# Patient Record
Sex: Female | Born: 1965 | Hispanic: Yes | Marital: Single | Smoking: Never smoker
Health system: Southern US, Community
[De-identification: ages and names within clinical notes are randomized; demographics above are authoritative.]

## PROBLEM LIST (undated history)

## (undated) DIAGNOSIS — J45909 Unspecified asthma, uncomplicated: Secondary | ICD-10-CM

## (undated) DIAGNOSIS — D649 Anemia, unspecified: Secondary | ICD-10-CM

## (undated) DIAGNOSIS — N63 Unspecified lump in unspecified breast: Secondary | ICD-10-CM

## (undated) HISTORY — DX: Unspecified lump in unspecified breast: N63.0

## (undated) HISTORY — DX: Unspecified asthma, uncomplicated: J45.909

## (undated) HISTORY — DX: Anemia, unspecified: D64.9

---

## 2002-06-10 DIAGNOSIS — J45909 Unspecified asthma, uncomplicated: Secondary | ICD-10-CM

## 2002-06-10 HISTORY — DX: Unspecified asthma, uncomplicated: J45.909

## 2007-08-08 ENCOUNTER — Emergency Department: Payer: Self-pay | Admitting: Emergency Medicine

## 2007-11-26 ENCOUNTER — Encounter: Payer: Self-pay | Admitting: Maternal & Fetal Medicine

## 2007-12-08 ENCOUNTER — Emergency Department: Payer: Self-pay | Admitting: Unknown Physician Specialty

## 2008-07-03 ENCOUNTER — Emergency Department: Payer: Self-pay | Admitting: Emergency Medicine

## 2011-06-11 DIAGNOSIS — D649 Anemia, unspecified: Secondary | ICD-10-CM

## 2011-06-11 DIAGNOSIS — N63 Unspecified lump in unspecified breast: Secondary | ICD-10-CM

## 2011-06-11 HISTORY — PX: BREAST BIOPSY: SHX20

## 2011-06-11 HISTORY — DX: Anemia, unspecified: D64.9

## 2011-06-11 HISTORY — DX: Unspecified lump in unspecified breast: N63.0

## 2011-06-11 HISTORY — PX: BREAST SURGERY: SHX581

## 2011-11-06 LAB — CBC
HCT: 22.1 % — ABNORMAL LOW (ref 35.0–47.0)
HGB: 6.4 g/dL — ABNORMAL LOW (ref 12.0–16.0)
MCV: 61 fL — ABNORMAL LOW (ref 80–100)
Platelet: 254 10*3/uL (ref 150–440)
RBC: 3.64 10*6/uL — ABNORMAL LOW (ref 3.80–5.20)
RDW: 20.1 % — ABNORMAL HIGH (ref 11.5–14.5)

## 2011-11-06 LAB — URINALYSIS, COMPLETE
Bacteria: NONE SEEN
Bilirubin,UR: NEGATIVE
Glucose,UR: NEGATIVE mg/dL (ref 0–75)
RBC,UR: NONE SEEN /HPF (ref 0–5)
Specific Gravity: 1.006 (ref 1.003–1.030)
WBC UR: NONE SEEN /HPF (ref 0–5)

## 2011-11-06 LAB — COMPREHENSIVE METABOLIC PANEL
Alkaline Phosphatase: 144 U/L — ABNORMAL HIGH (ref 50–136)
Anion Gap: 11 (ref 7–16)
Calcium, Total: 8.9 mg/dL (ref 8.5–10.1)
Chloride: 109 mmol/L — ABNORMAL HIGH (ref 98–107)
EGFR (African American): >60
EGFR (Non-African Amer.): >60
Glucose: 87 mg/dL (ref 65–99)
Osmolality: 278 (ref 275–301)
Potassium: 3.8 mmol/L (ref 3.5–5.1)
SGPT (ALT): 17 U/L
Sodium: 141 mmol/L (ref 136–145)
Total Protein: 8 g/dL (ref 6.4–8.2)

## 2011-11-06 LAB — HCG, QUANTITATIVE, PREGNANCY: Beta Hcg, Quant.: <1 m[IU]/mL — ABNORMAL LOW

## 2011-11-07 ENCOUNTER — Inpatient Hospital Stay: Payer: Self-pay | Admitting: Internal Medicine

## 2011-11-07 LAB — TROPONIN I: Troponin-I: <0.02 ng/mL

## 2011-11-07 LAB — FERRITIN: Ferritin (ARMC): 2 ng/mL — ABNORMAL LOW (ref 8–388)

## 2011-11-07 LAB — IRON AND TIBC
Iron Bind.Cap.(Total): 539 ug/dL — ABNORMAL HIGH (ref 250–450)
Iron Saturation: 2 %
Iron: 12 ug/dL — ABNORMAL LOW (ref 50–170)
Unbound Iron-Bind.Cap.: 527 ug/dL

## 2011-11-07 LAB — FOLATE: Folic Acid: 19.2 ng/mL (ref 3.1–100.0)

## 2011-11-07 LAB — CBC WITH DIFFERENTIAL/PLATELET
Basophil %: 0.6 %
Eosinophil #: 0.1 10*3/uL (ref 0.0–0.7)
Eosinophil %: 2.5 %
HGB: 7.1 g/dL — ABNORMAL LOW (ref 12.0–16.0)
Lymphocyte #: 1.6 10*3/uL (ref 1.0–3.6)
Lymphocyte %: 32 %
MCHC: 29.5 g/dL — ABNORMAL LOW (ref 32.0–36.0)
Monocyte %: 8.1 %
Platelet: 243 10*3/uL (ref 150–440)
RBC: 3.79 10*6/uL — ABNORMAL LOW (ref 3.80–5.20)
RDW: 23.7 % — ABNORMAL HIGH (ref 11.5–14.5)
WBC: 5 10*3/uL (ref 3.6–11.0)

## 2011-11-07 LAB — CK-MB: CK-MB: 0.7 ng/mL (ref 0.5–3.6)

## 2011-11-07 LAB — RETICULOCYTES
Absolute Retic Count: 0.1006 10*6/uL — ABNORMAL HIGH (ref 0.024–0.084)
Reticulocyte: 2.74 % — ABNORMAL HIGH (ref 0.5–1.5)

## 2011-11-07 LAB — LACTATE DEHYDROGENASE: LDH: 195 U/L (ref 84–246)

## 2011-11-09 HISTORY — PX: OTHER SURGICAL HISTORY: SHX169

## 2011-12-03 ENCOUNTER — Ambulatory Visit: Payer: Self-pay | Admitting: Family Medicine

## 2011-12-10 ENCOUNTER — Ambulatory Visit: Payer: Self-pay | Admitting: Family Medicine

## 2011-12-31 ENCOUNTER — Ambulatory Visit: Payer: Self-pay

## 2012-02-19 LAB — PATHOLOGY REPORT

## 2012-04-22 ENCOUNTER — Ambulatory Visit: Payer: Self-pay | Admitting: General Surgery

## 2013-01-06 ENCOUNTER — Ambulatory Visit: Payer: Self-pay

## 2013-01-25 ENCOUNTER — Ambulatory Visit: Payer: Self-pay | Admitting: General Surgery

## 2013-02-02 ENCOUNTER — Ambulatory Visit: Payer: Self-pay | Admitting: General Surgery

## 2013-02-04 ENCOUNTER — Ambulatory Visit: Payer: Self-pay | Admitting: General Surgery

## 2013-02-18 ENCOUNTER — Encounter: Payer: Self-pay | Admitting: *Deleted

## 2013-06-19 IMAGING — CT CT HEAD WITHOUT CONTRAST
2 series · 16 of 30 positions shown, 20 images · non-contrast
Comparison: none

REASON FOR EXAM: headache
COMMENTS:

PROCEDURE:     CT  - CT HEAD WITHOUT CONTRAST  - November 07, 2011  [DATE]
RESULT:     Comparison:  None
TECHNIQUE: Multiple axial images from the foramen magnum to the vertex were
obtained without IV contrast.

[Series 2: without · axial · non-contrast · 0.38mm/px · z∈[+358,+478]mm · 13 of 29 slices shown, 17 images]
[im 3/29  brain]
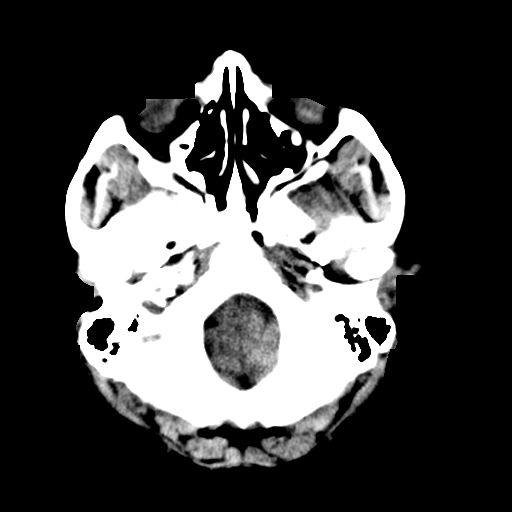
[im 3/29  bone]
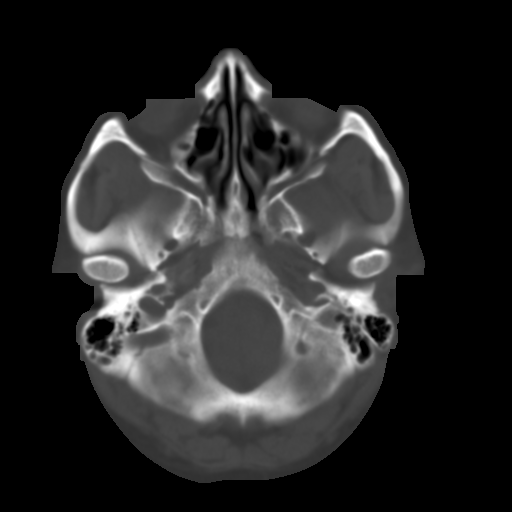
[im 5/29  brain]
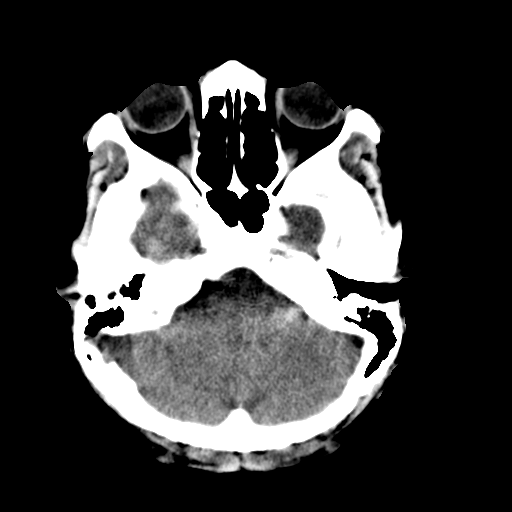
[im 7/29  brain]
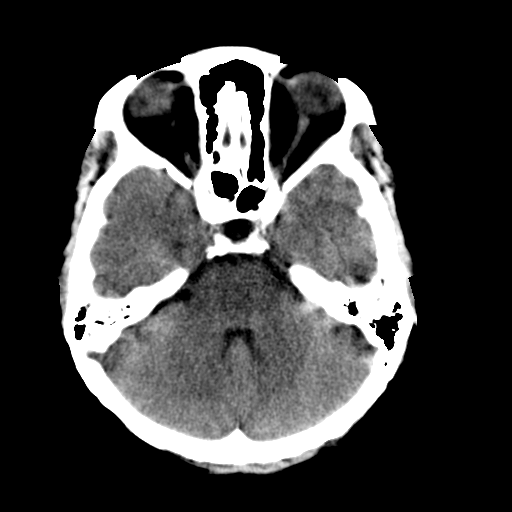
[im 9/29  brain]
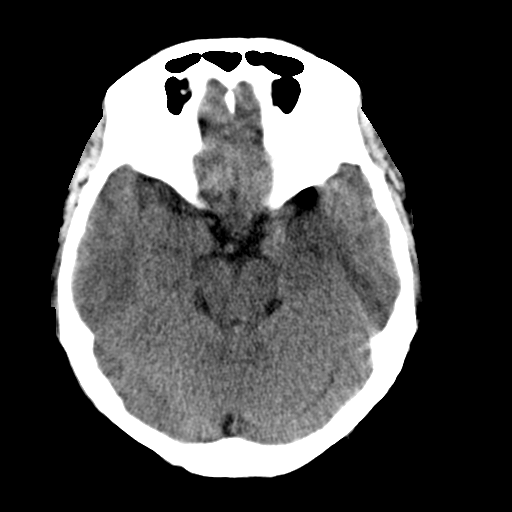
[im 11/29  brain]
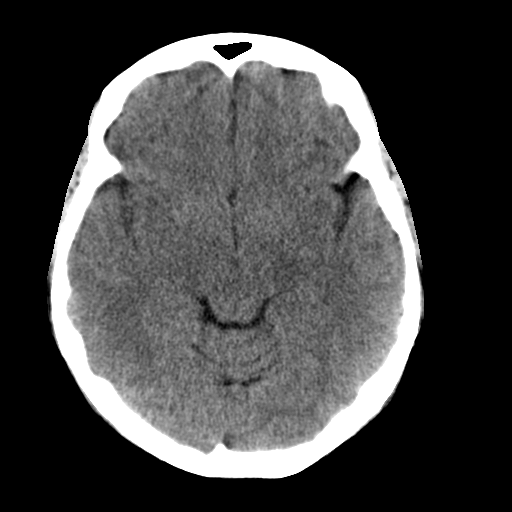
[im 11/29  bone]
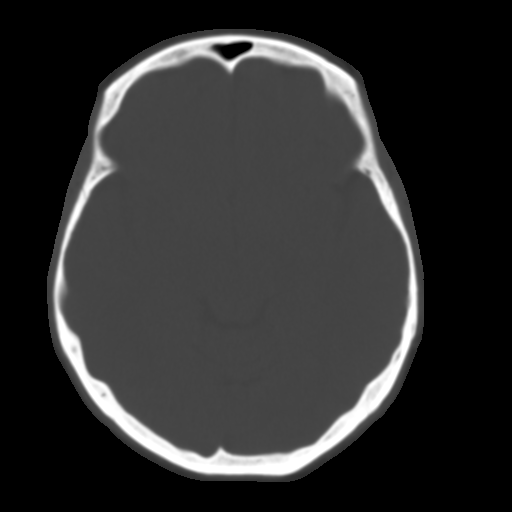
[im 13/29  brain]
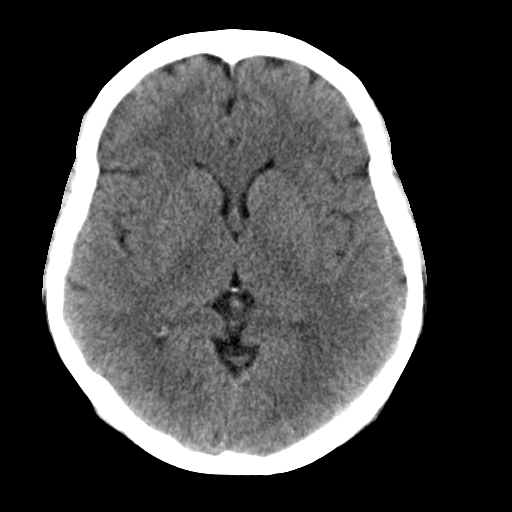
[im 15/29  brain]
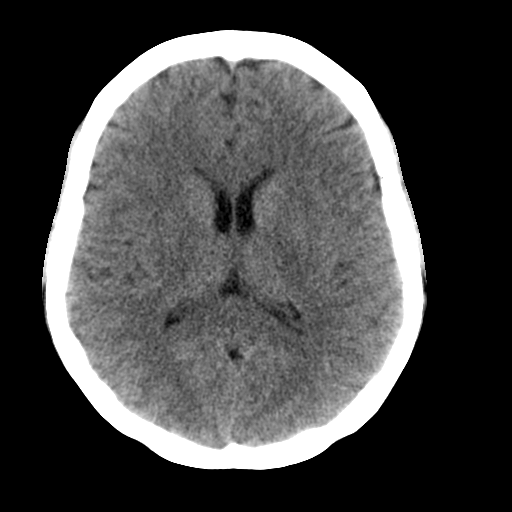
[im 17/29  brain]
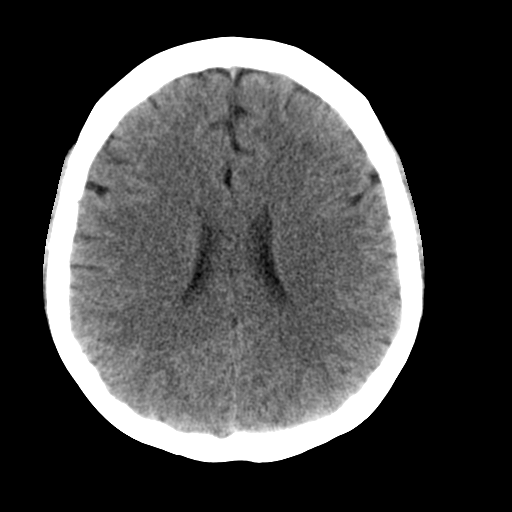
[im 19/29  brain]
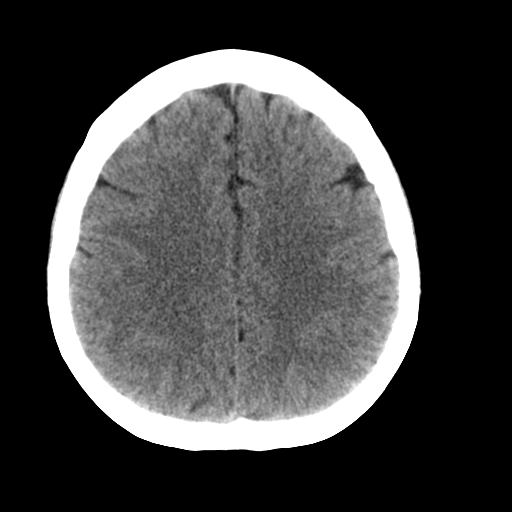
[im 19/29  bone]
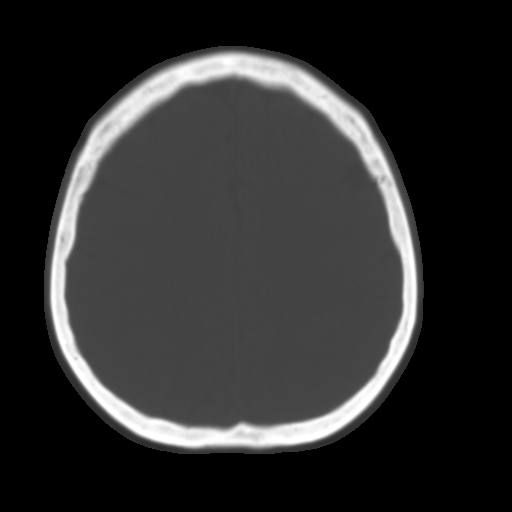
[im 21/29  brain]
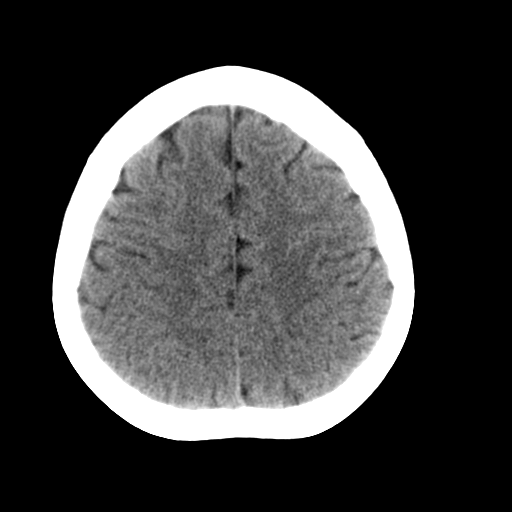
[im 23/29  brain]
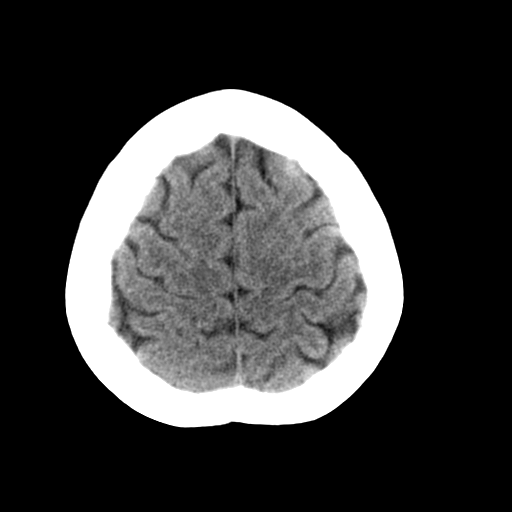
[im 25/29  brain]
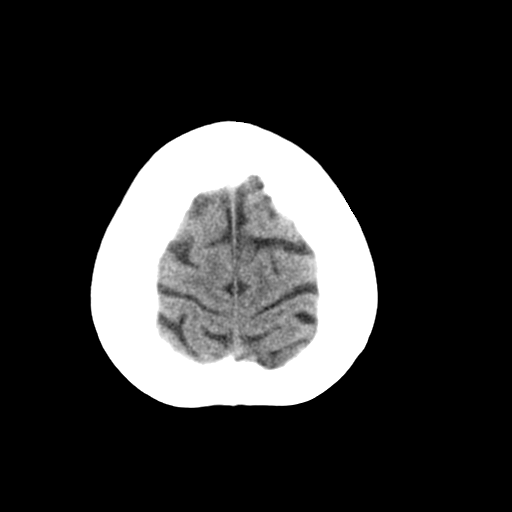
[im 27/29  brain]
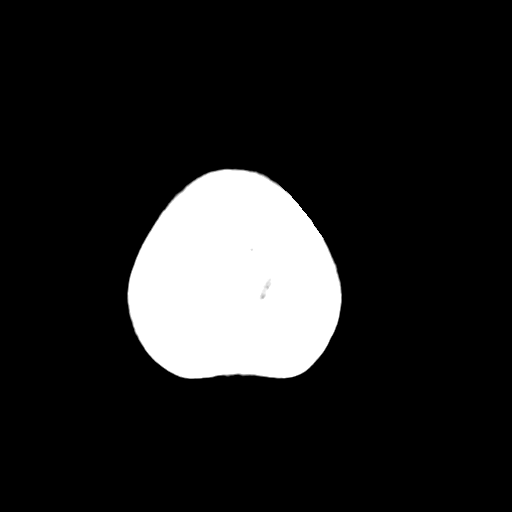
[im 27/29  bone]
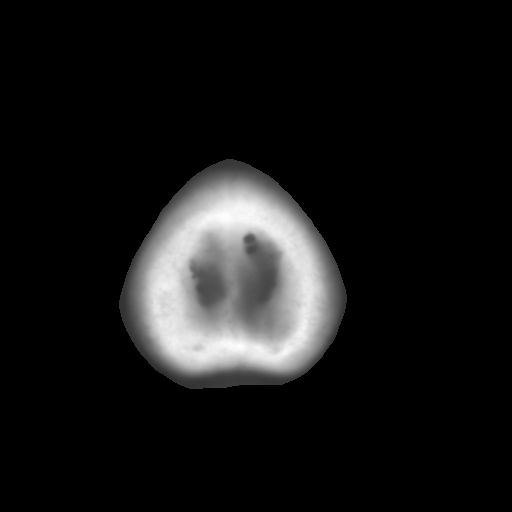

[Series 3: bone · axial · 0.38mm/px · z∈[+358,+398]mm · 3 of 29 slices shown]
[im 3/29  bone]
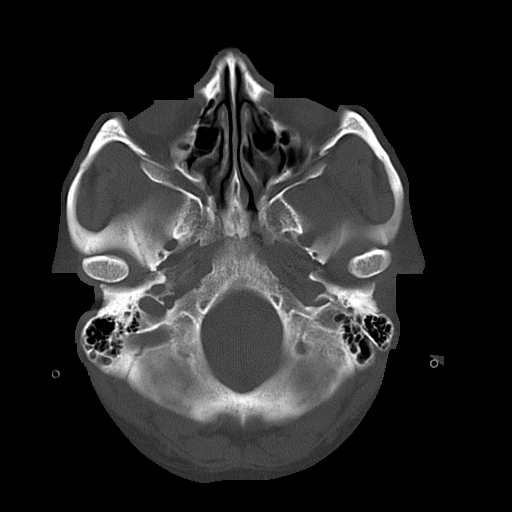
[im 7/29  bone]
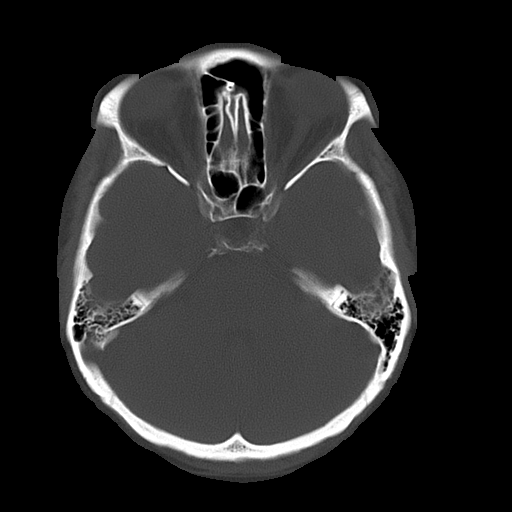
[im 11/29  bone]
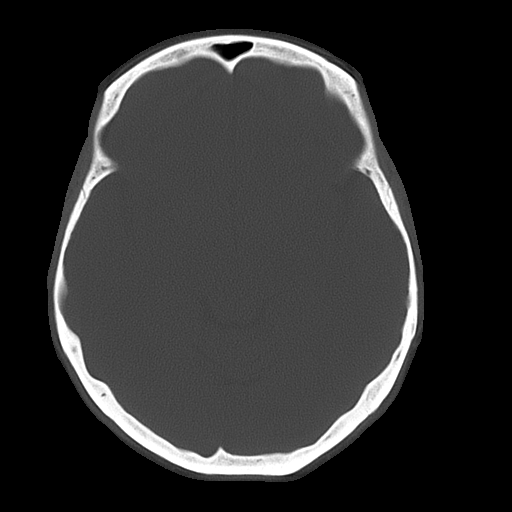

[16 of 30 positions shown; findings below may reference images not displayed]

FINDINGS: There is no evidence of mass effect, midline shift, or extra-axial fluid
collections.  There is no evidence of a space-occupying lesion or
intracranial hemorrhage. There is no evidence of a cortical-based area of
acute infarction.

The ventricles and sulci are appropriate for the patient's age. The basal
cisterns are patent.

Visualized portions of the orbits are unremarkable. The visualized portions
of the paranasal sinuses and mastoid air cells are unremarkable.

The osseous structures are unremarkable.
IMPRESSION: No acute intracranial process.

[REDACTED]

## 2014-03-10 LAB — COMPREHENSIVE METABOLIC PANEL
ALK PHOS: 218 U/L — AB
ALT: 52 U/L
AST: 44 U/L — AB (ref 15–37)
Albumin: 4.5 g/dL (ref 3.4–5.0)
Anion Gap: 3 — ABNORMAL LOW (ref 7–16)
BILIRUBIN TOTAL: 0.4 mg/dL (ref 0.2–1.0)
BUN: 9 mg/dL (ref 7–18)
CALCIUM: 10 mg/dL (ref 8.5–10.1)
Chloride: 109 mmol/L — ABNORMAL HIGH (ref 98–107)
Co2: 28 mmol/L (ref 21–32)
Creatinine: 0.87 mg/dL (ref 0.60–1.30)
EGFR (African American): >60
GLUCOSE: 106 mg/dL — AB (ref 65–99)
Osmolality: 279 (ref 275–301)
Potassium: 3.9 mmol/L (ref 3.5–5.1)
SODIUM: 140 mmol/L (ref 136–145)
TOTAL PROTEIN: 8.6 g/dL — AB (ref 6.4–8.2)

## 2014-03-10 LAB — CBC WITH DIFFERENTIAL/PLATELET
Basophil #: 0 10*3/uL (ref 0.0–0.1)
Basophil %: 0.4 %
EOS PCT: 2.4 %
Eosinophil #: 0.2 10*3/uL (ref 0.0–0.7)
HCT: 41.7 % (ref 35.0–47.0)
HGB: 13.4 g/dL (ref 12.0–16.0)
LYMPHS PCT: 21.3 %
Lymphocyte #: 1.7 10*3/uL (ref 1.0–3.6)
MCH: 25.7 pg — AB (ref 26.0–34.0)
MCHC: 32.1 g/dL (ref 32.0–36.0)
MCV: 80 fL (ref 80–100)
MONO ABS: 0.5 x10 3/mm (ref 0.2–0.9)
Monocyte %: 5.7 %
Neutrophil #: 5.6 10*3/uL (ref 1.4–6.5)
Neutrophil %: 70.2 %
Platelet: 236 10*3/uL (ref 150–440)
RBC: 5.21 10*6/uL — AB (ref 3.80–5.20)
RDW: 15.3 % — AB (ref 11.5–14.5)
WBC: 8 10*3/uL (ref 3.6–11.0)

## 2014-03-10 LAB — URINALYSIS, COMPLETE
BACTERIA: NONE SEEN
Bilirubin,UR: NEGATIVE
Blood: NEGATIVE
GLUCOSE, UR: NEGATIVE mg/dL (ref 0–75)
Ketone: NEGATIVE
Leukocyte Esterase: NEGATIVE
Nitrite: NEGATIVE
PH: 6 (ref 4.5–8.0)
Protein: NEGATIVE
Specific Gravity: 1.001 (ref 1.003–1.030)

## 2014-03-10 LAB — LIPASE, BLOOD: LIPASE: 217 U/L (ref 73–393)

## 2014-03-10 LAB — TROPONIN I
TROPONIN-I: 0.04 ng/mL
TROPONIN-I: 0.06 ng/mL — AB

## 2014-03-11 ENCOUNTER — Observation Stay: Payer: Self-pay | Admitting: Internal Medicine

## 2014-03-11 DIAGNOSIS — F419 Anxiety disorder, unspecified: Secondary | ICD-10-CM

## 2014-03-11 DIAGNOSIS — R000 Tachycardia, unspecified: Secondary | ICD-10-CM

## 2014-03-11 DIAGNOSIS — I361 Nonrheumatic tricuspid (valve) insufficiency: Secondary | ICD-10-CM

## 2014-03-11 DIAGNOSIS — I255 Ischemic cardiomyopathy: Secondary | ICD-10-CM

## 2014-03-11 LAB — HEMOGLOBIN A1C: HEMOGLOBIN A1C: 5.9 % (ref 4.2–6.3)

## 2014-03-11 LAB — CK TOTAL AND CKMB (NOT AT ARMC)
CK, TOTAL: 116 U/L
CK, Total: 125 U/L
CK, Total: 118 U/L
CK-MB: 0.8 ng/mL (ref 0.5–3.6)
CK-MB: 0.9 ng/mL (ref 0.5–3.6)
CK-MB: 0.8 ng/mL (ref 0.5–3.6)

## 2014-03-11 LAB — TROPONIN I: Troponin-I: <0.02 ng/mL

## 2014-03-18 ENCOUNTER — Telehealth: Payer: Self-pay

## 2014-03-18 NOTE — Telephone Encounter (Signed)
Spoke w/ Dr. Mariah MillingGollan who approved pt for #30 w/ no refills, [facility].  [facility] message w/ this info at [facility] and asked them to call back w/ any questions or concerns.

## 2014-03-18 NOTE — Telephone Encounter (Signed)
Pharmacy @ [facility], regarding a controlled substance Dr. Mariah MillingGollan prescribed (Xanax) and did not date and cannot fill until dates. Please call, pharmacy closed at 1.

## 2014-03-27 ENCOUNTER — Emergency Department: Payer: Self-pay | Admitting: Emergency Medicine

## 2014-03-27 LAB — BASIC METABOLIC PANEL
ANION GAP: 14 (ref 7–16)
BUN: 10 mg/dL (ref 7–18)
CALCIUM: 8.7 mg/dL (ref 8.5–10.1)
CREATININE: 0.95 mg/dL (ref 0.60–1.30)
Chloride: 104 mmol/L (ref 98–107)
Co2: 22 mmol/L (ref 21–32)
EGFR (African American): >60
EGFR (Non-African Amer.): >60
GLUCOSE: 125 mg/dL — AB (ref 65–99)
Osmolality: 280 (ref 275–301)
Potassium: 3.3 mmol/L — ABNORMAL LOW (ref 3.5–5.1)
Sodium: 140 mmol/L (ref 136–145)

## 2014-03-27 LAB — CBC
HCT: 39.3 % (ref 35.0–47.0)
HGB: 12.3 g/dL (ref 12.0–16.0)
MCH: 25.4 pg — ABNORMAL LOW (ref 26.0–34.0)
MCHC: 31.4 g/dL — AB (ref 32.0–36.0)
MCV: 81 fL (ref 80–100)
Platelet: 210 10*3/uL (ref 150–440)
RBC: 4.86 10*6/uL (ref 3.80–5.20)
RDW: 15.1 % — ABNORMAL HIGH (ref 11.5–14.5)
WBC: 7.5 10*3/uL (ref 3.6–11.0)

## 2014-03-27 LAB — HEPATIC FUNCTION PANEL A (ARMC)
Albumin: 3.9 g/dL (ref 3.4–5.0)
Alkaline Phosphatase: 231 U/L — ABNORMAL HIGH
BILIRUBIN TOTAL: 0.4 mg/dL (ref 0.2–1.0)
SGOT(AST): 72 U/L — ABNORMAL HIGH (ref 15–37)
SGPT (ALT): 83 U/L — ABNORMAL HIGH
Total Protein: 8.1 g/dL (ref 6.4–8.2)

## 2014-03-27 LAB — TROPONIN I

## 2014-03-27 LAB — LIPASE, BLOOD: Lipase: 241 U/L (ref 73–393)

## 2014-10-01 NOTE — Consult Note (Signed)
General Aspect PCP: [name] Primary Cardiologist: New to [name] _______________________  49 year old female without known history of cardiac disease, but history of HTN and anxiety and depression presented to [name] 03/10/2014 with chest pain after eating a pear with associated epigastric pain, SOB & anxiety which all lasted for 10 minutes before self resolving.  ______________________  PMH: 1) HTN 2) Anxiety 3) Depression ________________________   Present Illness 49 year old female with no known prior cardiac history, but history of HTN presented to [name] on 03/10/2014 with chest after eating a pear with associated epigastric pain, SOB, & anxiety which all lasted for about 10 minutes before self resolving. Cardiology consulted for chest pain, SOB  She did have similar symptoms about 1 month prior, not related to exertion and called her PCP's office to discuss. She was advised to go to the ED at that time but she did not. She also did not follow up with her PCP. She does relate a history, however of both exertional and non-exertional chest pain that radiates to her back. This has been going on for several months. She has never had this evaluated and reports a "crackle" sensation when exerting herself. She is not limited by this sensation. Her exertional limits are only 2/2 her bilateral knee pain.   On 03/10/2014 while eating a pear she bit into it and noticed something that was large, black, and hard. She thought this was poison and panicked. She had her son call 911, developed palpitations, anxiety, and chest pain/epigastric pain. She also drak milk as she believed this was good for poison. She denies any diaphoresis, vomiting, presyncope, or syncope.    Upon her arrival to [name] via EMS her initial 2 TnI were negative at <0.02--0.04, and she remained symptom free until late in the night. However, her third TnI came back at 0.06 and she was admitted for further evaluation. EKG with NSR,  68, inferior q's III, avF, and TWI V2. She did have 2nd episode of anxiety with associated epigastric pain while in the ED over night around 1 AM. EKG at that time showed sinus tach, 102, with a wandering baseline. Follow up EKG showed NSR, 86. The patient received 2 mg of Ativan IV at that time, which was ordered by the ED physician. Currently she asymptomatic and has remained that way since her episode in the ED overnight.  Her son is here for help with translation.   Physical Exam:  GEN well developed, well nourished, no acute distress   HEENT hearing intact to voice, moist oral mucosa   NECK supple   RESP normal resp effort  clear BS   CARD Regular rate and rhythm  No murmur   ABD denies tenderness  soft  normal BS   EXTR negative edema   SKIN normal to palpation   NEURO cranial nerves intact   PSYCH alert, A+O to time, place, person, good insight   Review of Systems:  Subjective/Chief Complaint Chest pain, SOB   General: No Complaints   Skin: No Complaints   ENT: No Complaints   Eyes: No Complaints   Neck: No Complaints   Respiratory: Short of breath   Cardiovascular: Chest pain or discomfort  Tightness  Palpitations   Gastrointestinal: Nausea   Genitourinary: No Complaints   Vascular: No Complaints   Musculoskeletal: No Complaints   Neurologic: No Complaints   Hematologic: No Complaints   Endocrine: No Complaints   Psychiatric: Nervousness  Depression  Anxiety  Review of Systems: All other systems were reviewed and found to be negative   Medications/Allergies Reviewed Medications/Allergies reviewed   Family & Social History:  Family and Social History:  Family History Negative  Mom: brain cancer; Sister: colon cancer   Social History negative tobacco, positive tobacco (Greater than 1 year), negative ETOH, negative Illicit drugs   Place of Living Home  lives in [name]     bone pain:    HTN:   Home Medications: Medication  Instructions Status  citalopram 20 mg oral tablet 1 tab(s) orally once a day Active  hydrochlorothiazide  orally once a day Active  Mobic  orally once a day Active   Lab Results:  Hepatic:  01-Oct-15 18:06   Bilirubin, Total 0.4  Alkaline Phosphatase  218 (46-116 NOTE: New Reference Range 12/28/13)  SGPT (ALT) 52 (14-63 NOTE: New Reference Range 12/28/13)  SGOT (AST)  44  Total Protein, Serum  8.6  Albumin, Serum 4.5  Routine Chem:  01-Oct-15 18:06   Hemoglobin A1c ([facility]) 5.9 (The American Diabetes Association recommends that a primary goal of therapy should be <7% and that physicians should reevaluate the treatment regimen in patients with HbA1c values consistently >8%.)  Glucose, Serum  106  BUN 9  Creatinine (comp) 0.87  Sodium, Serum 140  Potassium, Serum 3.9  Chloride, Serum  109  CO2, Serum 28  Calcium (Total), Serum 10.0  Osmolality (calc) 279  eGFR (African American) >60  eGFR (Non-African American) >60 (eGFR values <33m/min/1.73 m2 may be an indication of chronic kidney disease (CKD). Calculated eGFR, using the MRDR Study equation, is useful in  patients with stable renal function. The eGFR calculation will not be reliable in acutely ill patients when serum creatinine is changing rapidly. It is not useful in patients on dialysis. The eGFR calculation may not be applicable to patients at the low and high extremes of body sizes, pregnant women, and vetetarians.)  Anion Gap  3  Lipase 217 (Result(s) reported on 10 Mar 2014 at 06:35PM.)  Cardiac:  01-Oct-15 18:06   Troponin I < 0.02 (0.00-0.05 0.05 ng/mL or less: NEGATIVE  Repeat testing in 3-6 hrs  if clinically indicated. >0.05 ng/mL: POTENTIAL  MYOCARDIAL INJURY. Repeat  testing in 3-6 hrs if  clinically indicated. NOTE: An increase or decrease  of 30% or more on serial  testing suggests a  clinically important change)  CK, Total 125 (26-192 NOTE: NEW REFERENCE RANGE  07/12/2013)  CPK-MB, Serum  0.8 (Result(s) reported on 11 Mar 2014 at 03:16AM.)    19:55   Troponin I 0.04 (0.00-0.05 0.05 ng/mL or less: NEGATIVE  Repeat testing in 3-6 hrs  if clinically indicated. >0.05 ng/mL: POTENTIAL  MYOCARDIAL INJURY. Repeat  testing in 3-6 hrs if  clinically indicated. NOTE: An increase or decrease  of 30% or more on serial  testing suggests a  clinically important change)    22:00   Troponin I  0.06 (0.00-0.05 0.05 ng/mL or less: NEGATIVE  Repeat testing in 3-6 hrs  if clinically indicated. >0.05 ng/mL: POTENTIAL  MYOCARDIAL INJURY. Repeat  testing in 3-6 hrs if  clinically indicated. NOTE: An increase or decrease  of 30% or more on serial  testing suggests a  clinically important change)  02-Oct-15 09:25   Troponin I < 0.02 (0.00-0.05 0.05 ng/mL or less: NEGATIVE  Repeat testing in 3-6 hrs  if clinically indicated. >0.05 ng/mL: POTENTIAL  MYOCARDIAL INJURY. Repeat  testing in 3-6 hrs if  clinically indicated. NOTE: An  increase or decrease  of 30% or more on serial  testing suggests a  clinically important change)  Routine UA:  01-Oct-15 18:06   Color (UA) Colorless  Clarity (UA) Clear  Glucose (UA) Negative  Bilirubin (UA) Negative  Ketones (UA) Negative  Specific Gravity (UA) 1.001  Blood (UA) Negative  pH (UA) 6.0  Protein (UA) Negative  Nitrite (UA) Negative  Leukocyte Esterase (UA) Negative (Result(s) reported on 10 Mar 2014 at 06:38PM.)  RBC (UA) 1 /HPF  WBC (UA) 1 /HPF  Bacteria (UA) NONE SEEN  Epithelial Cells (UA) 2 /HPF  Mucous (UA) PRESENT (Result(s) reported on 10 Mar 2014 at 06:38PM.)  Routine Hem:  01-Oct-15 18:06   WBC (CBC) 8.0  RBC (CBC)  5.21  Hemoglobin (CBC) 13.4  Hematocrit (CBC) 41.7  Platelet Count (CBC) 236  MCV 80  MCH  25.7  MCHC 32.1  RDW  15.3  Neutrophil % 70.2  Lymphocyte % 21.3  Monocyte % 5.7  Eosinophil % 2.4  Basophil % 0.4  Neutrophil # 5.6  Lymphocyte # 1.7  Monocyte # 0.5  Eosinophil # 0.2  Basophil #  0.0 (Result(s) reported on 10 Mar 2014 at [facility].)   EKG:  EKG Interp. by me   Interpretation EKG shows NSR, 68, inferior q's III, aVF, & TWI V2 (no prior to compare)   Radiology Results:  XRay:    01-Oct-15 23:38, Chest PA and Lateral  Chest PA and Lateral   REASON FOR EXAM:    Andrea abd pain, SOB  COMMENTS:       PROCEDURE: DXR - DXR CHEST PA (OR AP) AND LATERAL  - Mar 10 2014 11:38PM     CLINICAL DATA:  Patient ago bite of pear that had a black spot on it  and then developed abdominal pain, dizziness, nausea, shortness of  breath, and Andrea stomach pain.    EXAM:  CHEST  2 VIEW    COMPARISON:  None.    FINDINGS:  The heart size and mediastinal contours are within normal limits.  Calcified granuloma in the left mid lung. Both lungs are otherwise  clear. The visualized skeletal structures are unremarkable.     IMPRESSION:  No active cardiopulmonary disease.      Electronically Signed    By: Lucienne Capers M.D.    On: 03/11/2014 00:05         Verified By: Neale Burly, M.D.,  [place]:    02-Oct-15 09:12, US Abdomen General Survey  US Abdomen General Survey   REASON FOR EXAM:    epigastric pain, elevated lft's  COMMENTS:   May transport without cardiac monitor    PROCEDURE: [place]  - US ABDOMEN GENERAL SURVEY  - Mar 11 2014  9:12AM     CLINICAL DATA:  Elevated liver enzymes and recent onset of  epigastric pain    EXAM:  ULTRASOUND ABDOMEN COMPLETE    COMPARISON:  None.    FINDINGS:  Gallbladder: No gallstones or wall thickening visualized. There is  no pericholecystic fluid. No sonographic Murphy sign noted.    Common bile duct: Diameter: 2 mm. There is no intrahepatic, common  hepatic, or common bile duct dilatation.    Liver: No focal lesion identified.  Liver echogenicity is increased.    IVC: No abnormality visualized.    Pancreas: Visualized portion unremarkable. Portions of pancreas are  obscured by gas.    Spleen: Size and appearance  within normal limits.    Right Kidney: Length:  9.5 cm. Echogenicity within normal limits. No  mass or hydronephrosis visualized.    Left Kidney: Length: 10.3 cm. Echogenicity within normal limits. No  mass or hydronephrosis visualized.    Abdominal aorta: No aneurysm visualized.    Other findings: No demonstrable ascites.     IMPRESSION:  Increased liver echogenicity, a finding most likely due to hepatic  steatosis. While no focal liver lesions are identified, it must be  cautioned that the sensitivity of ultrasound for focal liver lesions  is diminished in this circumstance. Portions of pancreas are  obscured by gas. Visualized portions of pancreas appear normal.  Study otherwise unremarkable.      Electronically Signed    By: Lowella Grip M.D.    On: 03/11/2014 09:18         Verified By: Leafy Kindle. Jasmine December, M.D.,  Cardiology:    02-Oct-15 08:03, Echo Doppler  Echo Doppler   REASON FOR EXAM:      COMMENTS:       PROCEDURE: [facility] - ECHO DOPPLER COMPLETE(TRANSTHOR)  - Mar 11 2014  8:03AM     RESULT: Echocardiogram Report    Patient Name:   [name] Date of Exam: 03/11/2014  Medical Rec #:  [id number]                Custom1:  Date of Birth:  Apr 17, 1966              Height:       62.0 in  Patient Age:    59 years              Weight:       163.0 lb  Patient Gender: F                     BSA:          1.75 m??    Indications: MI  Sonographer:    Janalee Dane RCS  Referring Phys: Azucena Freed, N    Summary:   1. Essentially a normal study   2. Left ventricular ejection fraction, by visual estimation, is 60 to   65%.   3. Normal global left ventricular systolic function.   4. Normal right ventricular size and systolic function.   5. Mild tricuspid regurgitation.   6. Normal RVSP  2D AND M-MODE MEASUREMENTS (normal ranges within parentheses):  Left Ventricle:          Normal  IVSd (2D):      0.89 cm (0.7-1.1)  LVPWd (2D):     0.67 cm (0.7-1.1)  Aorta/LA:            Normal  LVIDd (2D):     4.14 cm (3.4-5.7) Aortic Root (2D): 2.80 cm (2.4-3.7)  LVIDs (2D):     2.68 cm           Left Atrium (2D): 3.90 cm (1.9-4.0)  LV FS (2D):     35.3 %   (>25%)  LV EF (2D):     65.1 %   (>50%)             Right Ventricle:                                    RVd (2D):        3.08 cm  LV DIASTOLIC FUNCTION:  MV Peak E: 1.10 m/s E/e' Ratio: 13.60  MV Peak  A: 1.13 m/s Decel Time: 148 msec  E/A Ratio: 0.97  SPECTRAL DOPPLER ANALYSIS (where applicable):  Mitral Valve:  MV P1/2 Time: 42.92 msec  MV Area, PHT: 5.13 cm??  Tricuspid Valve and PA/RV Systolic Pressure: TR Max Velocity: 2.31 m/s RA   Pressure: 5 mmHg RVSP/PASP: 26.3 mmHg  PHYSICIAN INTERPRETATION:  Left Ventricle: The left ventricularinternal cavity size was normal. LV   posterior wall thickness was normal. No left ventricular hypertrophy.   Global LV systolic function was normal. Left ventricular ejection   fraction, by visual estimation, is 60 to 65%. Spectral Doppler shows   normal pattern of LV diastolic filling.  Right Ventricle: Normal right ventricular size, wall thickness, and   systolic function. The right ventricular size is normal. Global RV   systolic function is normal.  Left Atrium: The left atrium is normal in size.  Right Atrium: The right atrium is normal in size.  Pericardium: There is no evidence of pericardial effusion.  Mitral Valve: The mitral valve is normal in structure. Trace mitral valve   regurgitation is seen.  Tricuspid Valve: The tricuspid valve is normal. Mild tricuspid     regurgitation is visualized. The tricuspid regurgitant velocity is 2.31   m/s, and with an assumed right atrial pressure of 5 mmHg, the estimated   right ventricular systolic pressure is normal at 26.3 mmHg.  Aortic Valve: The aortic valve is normal. Mild aortic valve sclerosis is   present, with no evidence of aortic valve stenosis. Trivial aortic valve   regurgitation is  seen.  Pulmonic Valve: The pulmonic valve is normal. Trace pulmonic valve   regurgitation.  Aorta: The aortic root and ascending aorta are structurally normal, with   no evidence of dilitation.    [id number] Ida Rogue MD  Electronically signed by [id number] Ida Rogue MD  Signature Date/Time: 03/11/2014/1:17:23 PM  *** Final ***    IMPRESSION:.        Verified By: Minna Merritts, M.D., MD    No Known Allergies:   Vital Signs/Nurse's Notes:  **Vital Signs.:   02-Oct-15 12:01  Vital Signs Type Routine  Temperature Temperature (F) 98.1  Celsius 36.7  Temperature Source oral  Pulse Pulse 69  Respirations Respirations 18  Systolic BP Systolic BP 494  Diastolic BP (mmHg) Diastolic BP (mmHg) 63  Mean BP 76  Pulse Ox % Pulse Ox % 95  Pulse Ox Activity Level  At rest  Oxygen Delivery Room Air/ 21 %    Impression 49 year old female without known history of cardiac disease, but history of HTN and anxiety and depression presented to [facility] 03/10/2014 with chest pain after eating a pear with associated epigastric pain, SOB & anxiety which all lasted for 10 minutes before self resolving. While in the ED her TnI trend was <0.02--0.04--0.06. Overnight around 1 AM she had a panic attack/epigastric pain. EKG at that time 102, wandering baseline. Follow EKG with NSR, 86. Further TnI <0.02 this morning.   1. Demand ischemia in the setting of tachycardia:  -Patient had an acute anxiety attack 03/10/2014 which caused an elevated HR, this likely caused a small TnI leak as her intial TnI were negative, she had a small bump in her 3rd TnI, and follow up TnI by cardiology this AM was <0.02.  -Echo essentially normal, EF >55% -On aspirin 81 mg daily, metoprolol 25 mg bid  2. History of chest pain that radiates from the chest towards the back: -CT negative for PE or dissection  3. Anxiety no further workup at this time,  continue celexa will give several xanax for panic attack, follow up with  PMD  4) SOB: normal echo, sx from anxiety   Electronic Signatures: Rise Mu (PA-C)  (Signed 02-Oct-15 11:14)  Authored: General Aspect/Present Illness, History and Physical Exam, Review of System, Family & Social History, Past Medical History, Home Medications, Labs, EKG , Radiology, Allergies, Vital Signs/Nurse's Notes, Impression/Plan Ida Rogue (MD)  (Signed 02-Oct-15 16:57)  Authored: General Aspect/Present Illness, History and Physical Exam, Review of System, Family & Social History, Past Medical History, Home Medications, Labs, EKG , Radiology, Vital Signs/Nurse's Notes, Impression/Plan  Co-Signer: General Aspect/Present Illness, Home Medications, Allergies, Impression/Plan   Last Updated: 02-Oct-15 16:57 by Ida Rogue (MD)

## 2014-10-01 NOTE — Discharge Summary (Signed)
PATIENT NAMQuitman Livings:  [name], Jaryn MR#:  [id number] DATE OF BIRTH:  1966/02/05  DATE OF ADMISSION:  03/11/2014 DATE OF DISCHARGE:  03/11/2014  ADMITTING DIAGNOSIS: Episode of epigastric pain, history of chest pain with radiation to the back.   DISCHARGE DIAGNOSES:  1. Epigastric pain possibly due to gastritis, now resolved. No evidence of gallbladder disease on ultrasound.  2. Elevated liver function tests due to fatty liver. The patient needs weight loss.  3. Chest pain with radiation to the back seen by cardiology, recommended outpatient followup. Normal echocardiogram. Troponin was slightly elevated, felt to be likely due to demand ischemia.  4. Hypertension.  5. Apparently history of myocardial infarction, details unclear.   PERTINENT LABORATORIES AND EVALUATIONS: Admitting glucose 106, BUN 9, creatinine 0.87, sodium 140, potassium 3.9, chloride 119, CO2 28, calcium was 10, lipase 217. LFTs, total protein 8.6, albumin 4.5, bilirubin total 0.4, alkaline phosphatase 218, AST 44, ALT 52. Troponin was 0.02, 0.04, and 0.06, subsequent less than 0.02. WBC 8.0, hemoglobin 13.4, platelet count 236,000. Urinalysis was negative. Echocardiogram showed normal EF, mild tricuspid regurgitation.   HOSPITAL COURSE: Please refer to H and P done by the admitting physician. The patient is a 49 year old Hispanic female who presented with acute onset of epigastric pain after she had a pear.  The patient was evaluated in the ED and because she had abnormal troponin we were asked to admit the patient under observation. The patient had cardiac enzymes which remained negative. Was seen by cardiology who recommended outpatient followup. The patient has a history of having chest pain with radiation to the back, therefore is undergoing a CT for PE, if negative she will be discharged home. I strongly recommended the patient lose weight. At this time she is stable for discharge.   DISCHARGE MEDICATIONS: Mobic 1 tab p.o.  daily, hydrochlorothiazide 12.5 p.o. daily, citalopram 20 daily, aspirin 81 one tab p.o. daily.   DIET: Low-sodium, low-fat, low-cholesterol.   ACTIVITY: As tolerated.   FOLLOWUP: With primary MD. in 1-2.   TIME SPENT: 35 minutes.     ____________________________ Lacie ScottsShreyang H. Allena KatzPatel, MD shp:bu D: 03/11/2014 14:01:42 ET T: 03/11/2014 18:52:39 ET JOB#: [id number]  cc: Slater Mcmanaman H. Allena KatzPatel, MD, <Dictator> Charise CarwinSHREYANG H Lache Dagher MD ELECTRONICALLY SIGNED 03/20/2014 8:55

## 2014-10-01 NOTE — H&P (Signed)
PATIENT NAMEJEWELIANA, [name] MR#:  [id number] DATE OF BIRTH:  26-Jan-1966  DATE OF ADMISSION:  03/11/2014  REFERRING PHYSICIAN:  Coolidge Breeze, MD  ADMITTING PHYSICIAN:  Crissie Figures, MD  PRIMARY CARE PHYSICIAN:  [facility]  CHIEF COMPLAINT:  Epigastric pain.  HISTORY OF PRESENT ILLNESS:  A 49 year old Hispanic female with a past medical history of hypertension and generalized anxiety disorder/depression, who was in her usual state of health until this afternoon, when she was eating a pear. She suddenly felt epigastric pain associated with some shortness of breath and anxiety, which lasted for about 10 minutes, called EMS, and hence was brought to the Emergency Room for further evaluation. She did have some nausea but no vomiting along with that episode as well as epigastric pain. The patient did have similar symptoms about a month ago for which she called her doctor's office, and she was advised to go to the Emergency Room for further evaluation to rule out any cardiac condition, but the patient did not go to the Emergency Room and she did not follow up with her doctor regarding that episode. In the Emergency Room, the patient was evaluated by the ED physician and remained symptom-free while waiting in the Emergency Room, and the initial labs came back as normal. The initial troponin level was in the normal range 0.02. She also had a second set of troponin, but the third set of troponin came back elevated at 0.06. Hence, the hospitalist service was consulted for further evaluation and management. The patient rested comfortably while she was in the Emergency Room up until 1 a.m. this morning, when she had another episode of anxiety with a panic episode associated with some epigastric/chest discomfort. She was evaluated again by the ED physician at that time and had an EKG done. Initial EKG showed a sinus tachycardia of 102, and subsequently after 5 minutes, the repeat EKG  showed a normal sinus rhythm of 86. The patient received 2 mg of Ativan IV at that time, which was ordered by the ED physician. At the present time, the patient is resting comfortably in the bed, denies any chest pain or epigastric pain, and denies any shortness of breath, but she does look anxious. In view of her elevated troponin, third set, she is admitted to telemetry for further evaluation and management.  PAST MEDICAL HISTORY:   1.  Hypertension, which was diagnosed in May 2015. 2.  History of anxiety/depression. She was on antidepressant medication up until a year ago, but she has not been taking any antidepressant medications for the past 1 year.  PAST SURGICAL HISTORY:  Nonsignificant.  HOME MEDICATIONS:  Hydrochlorothiazide 12.5 mg 1 tablet a day,  meloxicam 10 mg once a day.  ALLERGIES:  No known drug allergies   FAMILY HISTORY:  No history of heart disease or strokes in the family.  Mom has a history of brain cancer and sister a history of colon cancer.   SOCIAL HISTORY:  She is single, has 7 children. She is currently unemployed. 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 No history of smoking or alcohol usage. Denies any history of IV drug usage.   REVIEW OF SYSTEMS: CONSTITUTIONAL:  No fever or fatigue. Negative for weakness. Negative for abnormal weight gain or weight loss. EYES:  Negative for blurred or double vision. No redness. No inflammation. EARS, NOSE, AND THROAT:  No tinnitus. No ear pain. No epistaxis. No discharge. No hearing loss. No redness of oropharynx. No  difficulty swallowing. RESPIRATORY:  She did have some shortness of breath when she had the episode of epigastric pain, but denies any shortness of breath now. No cough. No wheezing. No painful respirations. CARDIOVASCULAR:  She did have some epigastric pain earlier in the afternoon yesterday, but denies any chest pain now. No orthopnea. No pedal edema. No palpitations. No syncope. GASTROINTESTINAL:  She did have  some nausea, but no vomiting. No diarrhea. She did have some epigastric pain by eating a pear yesterday afternoon, but no pain now. No history of any chronic GERD symptoms. GENITOURINARY:  Denies any dysuria or hematuria.  ENDOCRINE:  No polyuria. No polydipsia. No heat or cold intolerance. HEMATOLOGIC:  No history of any easy bruising or bleeding. INTEGUMENTARY:  No rash or lesions. MUSCULOSKELETAL:  She does have some chronic arthritic pain for which she takes meloxicam 10 mg once a day.  NEUROLOGICAL:  Negative for any numbness or focal weakness. No dysarthria. She has had no CVA or TIA. No seizure disorder.  PSYCHIATRIC:  History of anxiety and depression present for which she took medications up until a year ago, but for the past 1 year, she is not on any medications.   PHYSICAL EXAMINATION: GENERAL:  Young hispanic woman, lying in the bed, looks anxious, obese, well built, not in any acute distress at this time. Alert, awake, and oriented x 3.  VITAL SIGNS:  Temperature 97.5, pulse 74 and regular, respirations 15, blood pressure 124/68, pulse oximetry 98 on room air, currently pain scale 0.  HEAD:  Atraumatic, normocephalic. EYES:  Pupils are equal, round, and reactive to light. No conjunctival pallor. No scleral icterus. Extraocular movements intact.  NOSE:  No nasal lesions or drainage. EARS:  No drainage. No external lesions. OROPHARYNX:  No mucosal lesions. No exudates.  NECK:  Supple. No JVD. No thyromegaly. No lymphadenopathy. Range of motion normal.  RESPIRATORY:  Good respiratory effort. Clear to auscultation. Clear to percussion.  CARDIOVASCULAR:  Heart rate regular rhythm. No murmurs, gallops, or clicks. Pulses are equal at carotid, femoral, and radial pulses. No peripheral edema.  GASTROINTESTINAL:  No tenderness or masses. No hepatosplenomegaly. Bowel sounds are equal in 4 quadrants. Abdomen is nondistended. No rebound. No guarding. No rigidity.  GENITOURINARY:  Deferred.   MUSCULOSKELETAL:  Gait is not tested. Range of motion adequate in all joints.  SKIN:  Inspection within normal limits. Well hydrated.  LYMPH NODES:  No cervical lymphadenopathy. VASCULAR:  Good dorsalis pedis and posterior tibial pulses. NEUROLOGICAL:  Alert, awake, and oriented x 3. Cranial nerves II through XII are grossly intact. DTRs are symmetrical, 2+, motor strength 4/4 bilaterally in upper and lower extremities.  PSYCHIATRIC:  Judgment and insight are adequate. Looks anxious. Alert, awake, and oriented x 3. Memory and mood within normal limits.  LABORATORY AND RADIOLOGIC DATA:  Serum glucose 109, BUN 9, creatinine 0.87, sodium 140, potassium 3.9, chloride 109, bicarbonate 28, estimated GFR more than 60, total calcium 10.0, lipase 217, total protein 8.6, albumin 4.5, total bilirubin 0.4. Alkaline phosphatase is elevated, which is 218. SGOT is slightly elevated at 44. SGPT is 52. Troponin, first set, is 0.02; second set 0.04; and third set elevated at 0.06. CBC:  WBC of 8.0, hemoglobin 13.4, hematocrit 41.7, platelet count 236, MCV 80. Urinalysis is clear, LE negative, nitrite negative, no bacteria.   EKG:  Normal sinus rhythm with ventricular rate of 84 per minute. Repeat EKG at the time of anxiety and chest pain episode:  Sinus tachycardia with ventricular rate  of 102. No acute ST-T changes.   Chest x-ray:  Heart size  is normal. No active cardiopulmonary disease.   ASSESSMENT AND PLAN:  A 49 year old Hispanic female with a past medical history of hypertension, generalized anxiety disorder/depression, who presents to the Emergency Room with an episode of epigastric pain associated with some shortness of breath and anxiety disorder. The patient was evaluated in the Emergency Room by the ED physician, found to have initial set of troponin normal with a normal EKG, but later found to have third set of troponin elevated, and the hospitalist service was consulted for further evaluation and  management.   1.  Elevated troponin with third set elevated, first 2 being in the normal range, in a patient with history of hypertension, presenting with symptoms of epigastric pain with shortness of breath, rule out acute coronary syndrome. Plan:  Admit to telemetry, aspirin, nitrate, beta blocker, subcutaneous Lovenox, echocardiogram, cardiology consultation for further evaluation.  2.  Elevated liver functions with elevated alkaline phosphatase of 218 with AST of 44. She had an episode of epigastric pain with some nausea, rule out gallbladder pathology. Will order a right upper quadrant sonogram and repeat LFTs. Consider further workup accordingly.  3.  History of hypertension, on hydrochlorothiazide 12.5 mg once a day at home. Blood pressure is under reasonable control. Continue home medications.  4.  History of generalized anxiety disorder/depression, not on any medications for the past 1 year. Monitor for now. Counseling done.   CODE STATUS:  FULL CODE.  TIME SPENT:  50 minutes.   ____________________________ Crissie Figures, MD enr:nb D: 03/11/2014 02:56:31 ET T: 03/11/2014 04:42:05 ET JOB#: [id number]  cc: Crissie Figures, MD, <Dictator> Unknown cc  Crissie Figures MD ELECTRONICALLY SIGNED 03/11/2014 21:21

## 2014-10-02 NOTE — Consult Note (Signed)
PATIENT NAMQuitman Carlson:  ALAS Carlson, Parrish MR#:  [id number] DATE OF BIRTH:  Feb 12, 1966  DATE OF CONSULTATION:  11/07/2011  REFERRING PHYSICIAN:  Alounthith Phichith, MD CONSULTING PHYSICIAN:  Lurline DelShaukat Hilde Churchman, MD  REASON FOR CONSULTATION: Severe microcytic iron deficiency anemia.   HISTORY OF PRESENT ILLNESS: This is a 49 year old female from [place]. The patient was recently found to be severely anemic with a hemoglobin of 6 by her primary care physician as outpatient. The patient has been feeling weak, tired, with significant loss of energy within the last six months or so. Denies any rectal bleeding, was tested to be heme-negative on admission. Denies any nausea, vomiting, abdominal pain, etc. The patient does have history of heavy menstrual cycles, sometimes twice a month and sometimes lasting as long as 10 days. She has had some OB/GYN evaluation in the past, although she never followed up with the OB/GYN physician. The patient also has a strong family history of colon cancer. Her sister died of colon cancer at age 50. The patient has never had a colonoscopy. The patient has received 2 units of packed RBCs and is currently being discharged.   PAST MEDICAL HISTORY:  1. Heavy menstrual periods as mentioned above.  2. History of depression.  3. Asthma.   PAST SURGICAL HISTORY: None.   ALLERGIES: None.   HOME MEDICATIONS:  1. Citalopram. 2. Ferrous sulfate. 3. Colace.   FAMILY HISTORY: Sister died at age 50 secondary to colon cancer.   SOCIAL HISTORY: Unremarkable.   REVIEW OF SYSTEMS: Grossly negative except for what is mentioned in the History of Present Illness.   PHYSICAL EXAMINATION:  GENERAL: Fairly well built female, does not appear to be in any acute distress, quite awake, alert, and oriented.   HEENT: Grossly unremarkable.   NECK: Neck veins are flat.   LUNGS: Grossly clear to auscultation bilaterally with fair air entry and no added sounds.   CARDIOVASCULAR: Regular  rate and rhythm. No gallops or murmur.   ABDOMEN: Quite soft and benign. Bowel sounds positive. Nontender, nondistended. No rebound or guarding was noted.   NEUROLOGIC: Examination appears to be unremarkable.   LABORATORY, DIAGNOSTIC, AND RADIOLOGICAL DATA: Hemoglobin on admission was 6.4, MCV 61, white cell count of 6.5, and platelet count 254. Iron saturation is only 2%.   ASSESSMENT AND PLAN: The patient is with severe iron deficiency microcytic anemia. Most likely her anemia is secondary to heavy menstrual periods as mentioned above. Chronic gastrointestinal blood loss is another consideration especially since the patient has a strong family history of colon cancer. I agree with Dr. Margie EgePhichith that an outpatient colonoscopy is needed. This has been discussed with the patient and her significant other who does understanding English fairly well. They are in full agreement. The patient will follow up with me in about two weeks as an outpatient and arrangements will be made for an outpatient colonoscopy for further evaluation.   Thank you so much, Dr. Margie EgePhichith, for involving me in the care of Ms. [name].  ____________________________ Lurline DelShaukat Andrea Tenpenny, MD si:ap D: 11/07/2011 19:11:43 ET             T: 11/08/2011 08:37:59 ET JOB#: [id number] cc: Lurline DelShaukat Andrea Kinkade, MD, <Dictator> Lurline DelSHAUKAT Baldemar Dady MD ELECTRONICALLY SIGNED 11/08/2011 18:36

## 2014-10-02 NOTE — H&P (Signed)
PATIENT NAMENARMEEN, [name] MR#:  [id number] DATE OF BIRTH:  10/16/1965  DATE OF ADMISSION:  11/07/2011  REFERRING PHYSICIAN: Dr. Buford Dresser   PRIMARY CARE PHYSICIAN: [facility]    PRESENTING COMPLAINT: Fatigue, weakness, headache, increased somnolence.   HISTORY OF PRESENT ILLNESS: [name] is a 49 year old woman with history of depression and asthma during pregnancy who presents after being recommended by [facility] for evaluation in the setting of anemia. The patient presented to [facility] on 11/05/2011 with complaints of fatigue and white spots on her arm as well as headache and depression and lack of interest in her daily activities. On their initial work-up, a CBC was obtained and the patient was found to be anemic with hemoglobin around 6. The patient was called and recommended to come to the Emergency Room for further evaluation. On presentation she reports that for the past one year now she has been having these symptoms but for the past six months she's been having accelerated progression of her symptoms with worsening on presentation. She reports shortness of breath for the past two months and also in the past two months has had chest pain x2 when she was at work. She endorses intermittent shakes and chills. She also endorses presyncope for the past one year but no syncope. Denies any orthopnea or PND. She endorses also intermittent palpitations. The patient reports that approximately two years ago she was seen at [facility] for the exact same symptoms, apparently was given iron supplementation and her symptoms improved but the patient never actually followed up and no detailed history as far as what was actually done for the patient with work-up and etiology of her anemia. She endorses heavy menstrual bleeding and irregular bleeding, sometimes two times per month, sometimes lasting greater than 10 days. However, she reports that  her last menstrual cycle this time was on 09/18/2011. The patient also endorses that her sister died at age 68 secondary to colon cancer. She denies any weight loss. She does endorse poor appetite.   PAST MEDICAL HISTORY:  1. Depression with history of suicidal ideation approximately 13 years ago. According to [facility]' note, she was written for prescription for citalopram on 05/28 but the patient has not had the prescription filled yet.  2. Asthma during pregnancy.  3. Sounds like dysfunctional uterine bleeding.   PAST SURGICAL HISTORY: None.   ALLERGIES: No known drug allergies.   MEDICATIONS: Again, has not yet taken but prescription written for:  1. Citalopram 20 mg daily.  2. Ferrous sulfate 325 mg t.i.d.  3. Colace 100 mg b.i.d. as needed.   Again, the patient has not taken any of these medications.   FAMILY HISTORY: Sister died at age 80 secondary to colon cancer.   SOCIAL HISTORY: She lives in [place] with her children and grandchildren. She has seven children. Her husband was deported back [place] She transported here 13 years ago. One of her daughters is a teen mom. She denies any tobacco use. Has rare alcohol use. No drug use. She works in a Scientific laboratory technician.  REVIEW OF SYSTEMS: CONSTITUTIONAL: Denies any fever. She endorses intermittent shakes and chills and poor appetite. EYES: No visual disturbances. ENT: No epistaxis or discharge. RESPIRATORY: Reports chronic dry cough since her diagnosis of asthma during pregnancy. CARDIOVASCULAR: As per history of present illness with chest pain. She reports dependent edema from work. She endorses intermittent palpitations and presyncope. GI: Endorses nausea but  no vomiting, diarrhea, abdominal pain, hematemesis, or melena. GU: No dysuria or hematuria. ENDOCRINE: No polyuria or polydipsia. HEME: No easy bleeding. SKIN: Reports hyperpigmented lesions on her bilateral upper extremities and itching of those  lesions. NEUROLOGIC: She has generalized weakness. No one-sided weakness or numbness. PSYCH: Denies any suicidal ideation currently.   PHYSICAL EXAMINATION:    VITAL SIGNS: Temperature 98.1, pulse 79, respiratory rate 18, blood pressure 119/63, sating at 100% on 2 liters.   GENERAL: Lying in bed in no apparent distress.   HEENT: Normocephalic, atraumatic. Pupils equal, symmetric, nonicteric. She has pale conjunctivae. Nares with nasal cannula in place. She has slightly dry mucous membranes with pale mucous membrane.   NECK: Soft and supple. No adenopathy or JVP.   CARDIOVASCULAR: Non-tachy. No murmurs, rubs, or gallops.   LUNGS: Clear to auscultation bilaterally. No use of accessory muscles or increased respiratory effort.   ABDOMEN: Soft. Positive bowel sounds. No mass appreciated.   EXTREMITIES: No edema. Dorsal pedis pulses intact.   MUSCULOSKELETAL: No joint effusion on exam. She has full range of motion. No spinal tenderness.   NEUROLOGIC: No dysarthria or aphasia. Symmetrical strength. No focal deficits.   PSYCH: She is alert and oriented. The patient is cooperative.   PERTINENT LABS AND STUDIES: Glucose 87, BUN 6, creatinine 0.6, sodium 141, potassium 3.8, chloride 109, carbon dioxide 21, calcium 8.9, total bilirubin 0.5, alkaline phosphatase 144, ALT 17, AST 22, total protein 9, albumin 4, WBC 6.5, hemoglobin 6.4, hematocrit 22.1, platelet 254, MCV 61. Beta hCG less than 1. Troponin less than 0.02. Urinalysis with specific gravity of 1.006, blood negative, pH 7, leukocyte esterase 1+.   EKG was normal sinus rate, 70. No ST elevation. There is T wave inversion in aVR and V1.   CT of the head is pending.   ASSESSMENT AND PLAN: [name] is a 49 year old woman with history of depression and asthma during pregnancy who presents today with reports of fatigue, chest pain, shortness of breath, headache, and increased somnolence,  1. Symptomatic microcytic anemia more than  likely in the setting of dysfunctional uterine bleed based on her history and especially since she was treated two years ago at [facility]. Will obtain records from there for further clarification. Will send iron studies. HIV and TSH has been ordered by [facility]. She does have a significant family history of colon cancer. She is guaiac-negative in the ED. Will obtain GI consult for recommendations. Will likely need Hematology followup if her GI workup is negative. Once her iron studies have been obtained, will plan for transfusion of 1 unit initially.  2. Depression with distant history of suicidal ideation. She currently denies. She declined psych evaluation at this time. Will start her on her citalopram as recommended previously.  3. Chronic back pain which she reports has been going on for 13 years and bilateral knee pain for the past two years as well as right elbow pain. Will start off obtaining back x-ray and bilateral knee x-rays. The patient does not have adequate follow-up, probably arthritis given the chronicity without any detriment. Continue to follow. The patient will need again reassessment via PCP.  4. Prophylaxis with TEDs, SCDs, and Protonix.   TIME SPENT: Approximately 55 minutes spent on patient care. Interpreter was present the entire evaluation and assessment.   ____________________________ Reuel Derby, MD ap:drc D: 11/07/2011 01:09:00 ET T: 11/07/2011 08:37:59 ET JOB#: [id number]  cc: Carina Chaplin, MD, <Dictator> [facility]   [place]  MD ELECTRONICALLY SIGNED 11/14/2011 22:33

## 2014-10-02 NOTE — Discharge Summary (Signed)
PATIENT NAMQuitman Livings:  [name], Kursten MR#:  [id number] DATE OF BIRTH:  1965-07-16  DATE OF ADMISSION:  11/07/2011 DATE OF DISCHARGE:  11/07/2011  ADMISSION DIAGNOSIS: Iron deficiency anemia.   DISCHARGE DIAGNOSES:  1. Significant iron deficiency anemia.  2. Depression.  3. Chronic back pain.  4. Breast pain.  CONSULT: Dr. Niel HummerIftikhar.   LABORATORY, DIAGNOSTIC AND RADIOLOGICAL DATA: Laboratories at discharge: Troponins x2 were negative. White blood cells 5, hemoglobin 7.1, hematocrit 25, platelets 243, ferritin 2, LDH 195, serum iron 12, serum saturation 2, TIBC 539, folic acid 19.2, vitamin B12 296.   HOSPITAL COURSE: 49 year old female presents with symptomatic microcytic anemia. For further details, please refer to the history and physical.  1. Symptomatic microcytic anemia more likely in the setting of dysfunctional uterine bleeding. Iron studies are consistent with severe iron deficiency. Patient will have outpatient follow up with hematology oncology for possible IV iron. We appreciate GI consult. Patient will be scheduled for an outpatient colonoscopy with Dr. Niel HummerIftikhar in 2 to 4 weeks. Patient was started on ferrous sulfate.  2. History of depression. Patient denies any suicidal ideations.  3. Chronic back pain. X-rays are negative, likely secondary to arthritis.  4. Breast pain. Patient has been referred to the free breast clinic.   DISCHARGE MEDICATIONS:  1. Ferrous sulfate 325 b.i.d.  2. Vitamin C 500 mg daily.   DISCHARGE DIET: Regular.   DISCHARGE ACTIVITY: As tolerated.   DIS[facility]GE FOLLOW UP[facility] at 4:00, Dr. Niel HummerIftikhar [facility] two weeks [facility] 4:30. Patient will also need a follow up with hematology oncology in 2 to 4 weeks.   TIME SPENT: Approximately 35 minutes.    ____________________________ Janyth ContesSital P. Juliene PinaMody, MD spm:cms D: 11/08/2011 13:28:22 ET T: 11/08/2011 13:45:27 ET JOB#: [id number]  cc: Teague Goynes P. Juliene PinaMody, MD,  <Dictator> Lurline DelShaukat Iftikhar, MD Janyth ContesSITAL P Saniah Schroeter MD ELECTRONICALLY SIGNED 11/15/2011 12:40

## 2015-06-27 ENCOUNTER — Other Ambulatory Visit: Payer: Self-pay | Admitting: Family Medicine

## 2015-06-27 DIAGNOSIS — Z124 Encounter for screening for malignant neoplasm of cervix: Secondary | ICD-10-CM

## 2015-08-30 ENCOUNTER — Ambulatory Visit
Admission: RE | Admit: 2015-08-30 | Discharge: 2015-08-30 | Disposition: A | Discharge status: Home / Self Care | Payer: Self-pay | Source: Ambulatory Visit | Attending: Oncology | Admitting: Oncology

## 2015-08-30 ENCOUNTER — Ambulatory Visit: Payer: Self-pay | Attending: Oncology

## 2015-08-30 VITALS — BP 161/83 | HR 65 | Temp 98.2°F | Ht 62.6 in | Wt 156.6 lb

## 2015-08-30 DIAGNOSIS — Z0000 Encounter for general adult medical examination without abnormal findings: Secondary | ICD-10-CM

## 2015-08-30 NOTE — Progress Notes (Unsigned)
Subjective:     Patient ID: [name], female   DOB: 03/24/1966, 50 y.o.   MRN: [id number]  HPI   Review of Systems     Objective:   Physical Exam  Pulmonary/Chest: Right breast exhibits no inverted nipple, no mass, no nipple discharge, no skin change and no tenderness. Left breast exhibits no inverted nipple, no mass, no nipple discharge, no skin change and no tenderness. Breasts are symmetrical.       Assessment:    50 year [facility] Patient screened, and meets [facility] eligibility.  Patient does not have insurance, Medicare or Medicaid.  Handout given on Affordable Care Act.Instructed patient on breast self-exam using teach back method.  CBE unremarkable.  No mass or lump palpated.  Patient from [place].  Daughter present during exam at patient request. Delos HaringLoyda Murr interpreted exam    Plan:     Sent for bilateral screening mammogram.

## 2015-09-08 NOTE — Progress Notes (Signed)
Physical exam documented in separate note under Documentation for same date.

## 2018-03-25 ENCOUNTER — Ambulatory Visit: Payer: Self-pay | Attending: Oncology | Admitting: *Deleted

## 2018-03-25 ENCOUNTER — Other Ambulatory Visit: Payer: Self-pay

## 2018-03-25 ENCOUNTER — Ambulatory Visit
Admission: RE | Admit: 2018-03-25 | Discharge: 2018-03-25 | Disposition: A | Discharge status: Home / Self Care | Payer: Self-pay | Source: Ambulatory Visit | Attending: Oncology | Admitting: Oncology

## 2018-03-25 ENCOUNTER — Encounter (INDEPENDENT_AMBULATORY_CARE_PROVIDER_SITE_OTHER): Payer: Self-pay

## 2018-03-25 VITALS — BP 165/89 | HR 64 | Temp 98.3°F | Ht 63.0 in | Wt 160.0 lb

## 2018-03-25 DIAGNOSIS — Z0000 Encounter for general adult medical examination without abnormal findings: Secondary | ICD-10-CM

## 2018-03-25 NOTE — Progress Notes (Signed)
  Subjective:     Patient ID: [name], female   DOB: July 27, 1965, 52 y.o.   MRN: [id number]  HPI   Review of Systems     Objective:   Physical Exam  Pulmonary/Chest: Right breast exhibits tenderness. Right breast exhibits no inverted nipple, no mass, no nipple discharge and no skin change. Left breast exhibits no inverted nipple, no mass, no nipple discharge, no skin change and no tenderness.       Assessment:     52 year old [place] female returns to [facility] for annual screening.  Lloyda, the interpreter present during the interview and exam.  Clinical breast exam unremarkable.  Taught self breast awareness.  Last pap smear on 05/29/15 was negative / negative.  Next pap due in 2021.  Blood pressure elevated at 165/89.  She is to recheck her blood pressure at [facility] or [facility], and if remains higher than 140/90 she is to follow-up with her primary care provider.  Patient has been screened for eligibility.  She does not have any insurance, Medicare or Medicaid.  She also meets financial eligibility.  Hand-out given on the Affordable Care Act.    Plan:     Screening mammogram ordered.  Will follow-up per BCCCP protocol.

## 2018-03-25 NOTE — Patient Instructions (Signed)
Hipertensión  Hypertension  La hipertensión, conocida comúnmente como presión arterial alta, se produce cuando la sangre bombea en las arterias con mucha fuerza. Las arterias son los vasos sanguíneos que transportan la sangre desde el corazón al resto del cuerpo. La hipertensión hace que el corazón haga más esfuerzo para bombear sangre y puede provocar que las arterias se estrechen o endurezcan. La hipertensión no tratada o no controlada puede causar infarto de miocardio, accidentes cerebrovasculares, enfermedad renal y otros problemas.  Una lectura de la presión arterial consiste de un número más alto sobre un número más bajo. En condiciones ideales, la presión arterial debe estar por debajo de 120/80. El primer número ("superior") es la presión sistólica. Es la medida de la presión de las arterias cuando el corazón late. El segundo número ("inferior") es la presión diastólica. Es la medida de la presión en las arterias cuando el corazón se relaja.  ¿Cuáles son las causas?  Se desconoce la causa de esta afección.  ¿Qué incrementa el riesgo?  Algunos factores de riesgo de hipertensión están bajo su control. Otros no.  Factores que puede modificar  · Fumar.  · Tener diabetes mellitus tipo 2, colesterol alto, o ambos.  · No hacer la cantidad suficiente de actividad física o ejercicio.  · Tener sobrepeso.  · Consumir mucha grasa, azúcar, calorías o sal (sodio) en su dieta.  · Beber alcohol en exceso.  Factores que son difíciles o imposibles de modificar  · Tener enfermedad renal crónica.  · Tener antecedentes familiares de presión arterial alta.  · La edad. Los riesgos aumentan con la edad.  · La raza. El riesgo es mayor para las personas afroamericanas.  · El sexo. Antes de los 45 años, los hombres corren más riesgo que las mujeres. Después de los 65 años, las mujeres corren más riesgo que los hombres.  · Tener apnea obstructiva del sueño.  · El estrés.  ¿Cuáles son los signos o los síntomas?   La presión arterial extremadamente alta (crisis hipertensiva) puede provocar:  · Dolor de cabeza.  · Ansiedad.  · Falta de aire.  · Hemorragia nasal.  · Náuseas y vómitos.  · Dolor de pecho intenso.  · Una crisis de movimientos que no puede controlar (convulsiones).    ¿Cómo se diagnostica?  Esta afección se diagnostica midiendo su presión arterial mientras se encuentra sentado, con el brazo apoyado sobre una superficie. El brazalete del tensiómetro debe colocarse directamente sobre la piel de la parte superior del brazo y al nivel de su corazón. Debe medirla al menos dos veces en el mismo brazo. Determinadas condiciones pueden causar una diferencia de presión arterial entre el brazo izquierdo y el derecho.  Ciertos factores pueden provocar que las lecturas de la presión arterial sean inferiores o superiores a lo normal (elevadas) por un período corto de tiempo:  · Si su presión arterial es más alta cuando se encuentra en el consultorio del médico que cuando la mide en su hogar, se denomina "hipertensión de bata blanca". La mayoría de las personas que tienen esta afección no deben ser medicadas.  · Si su presión arterial es más alta en el hogar que cuando se encuentra en el consultorio del médico, se denomina "hipertensión enmascarada". La mayoría de las personas que tienen esta afección deben ser medicadas para controlar la presión arterial.    Si tiene una lecturas de presión arterial alta durante una visita o si tiene presión arterial normal con otros factores de riesgo:  · Es posible que se le   pida que regrese otro día para volver a controlar su presión arterial.  · Se le puede pedir que se controle la presión arterial en su casa durante 1 semana o más.    Si se le diagnostica hipertensión, es posible que se le realicen otros análisis de sangre o estudios de diagnóstico por imágenes para ayudar a su médico a comprender su riesgo general de tener otras afecciones.  ¿Cómo se trata?   Esta afección se trata haciendo cambios saludables en el estilo de vida, tales como ingerir alimentos saludables, realizar más ejercicio y reducir el consumo de alcohol. El médico puede recetarle medicamentos si los cambios en el estilo de vida no son suficientes para lograr controlar la presión arterial y si:  · Su presión arterial sistólica está por encima de 130.  · Su presión arterial diastólica está por encima de 80.    La presión arterial deseada puede variar en función de las enfermedades, la edad y otros factores personales.  Siga estas instrucciones en su casa:  Comida y bebida  · Siga una dieta con alto contenido de fibras y potasio, y con bajo contenido de sodio, azúcar agregada y grasas. Un ejemplo de plan alimenticio es la dieta DASH (Dietary Approaches to Stop Hypertension, Métodos alimenticios para detener la hipertensión). Para alimentarse de esta manera:  ? Coma mucha fruta y verdura fresca. Trate de que la mitad del plato de cada comida sea de frutas y verduras.  ? Coma cereales integrales, como pasta integral, arroz integral y pan integral. Llene aproximadamente un cuarto del plato con cereales integrales.  ? Coma y beba productos lácteos con bajo contenido de grasa, como leche descremada o yogur bajo en grasas.  ? Evite la ingesta de cortes de carne grasa, carne procesada o curada, y carne de ave con piel. Llene aproximadamente un cuarto del plato con proteínas magras, como pescado, pollo sin piel, frijoles, huevos y tofu.  ? Evite ingerir alimentos prehechos o procesados. En general, estos tienen mayor cantidad de sodio, azúcar agregada y grasa.  · Reduzca su ingesta diaria de sodio. La mayoría de las personas que tienen hipertensión deben comer menos de 1500 mg de sodio por día.  · Limite el consumo de alcohol a no más de 1 medida por día si es mujer y no está embarazada y a 2 medidas por día si es hombre. Una medida equivale  a 12 onzas de cerveza, 5 onzas de vino o 1½ onzas de bebidas alcohólicas de alta graduación.  Estilo de vida  · Trabaje con su médico para mantener un peso saludable o perder peso. Pregúntele cual es su peso recomendado.  · Realice al menos 30 minutos de ejercicio que haga que se acelere su corazón (ejercicio aeróbico) la mayoría de los días de la semana. Estas actividades pueden incluir caminar, nadar o andar en bicicleta.  · Incluya ejercicios para fortalecer sus músculos (ejercicios de resistencia), como pilates o levantamiento de pesas, como parte de su rutina semanal de ejercicios. Intente realizar 30 minutos de este tipo de ejercicios al menos tres días a la semana.  · No consuma ningún producto que contenga nicotina o tabaco, como cigarrillos y cigarrillos electrónicos. Si necesita ayuda para dejar de fumar, consulte al médico.  · Contrólese la presión arterial en su casa según las indicaciones del médico.  · Concurra a todas las visitas de control como se lo haya indicado el médico. Esto es importante.  Medicamentos  · Tome los medicamentos de venta libre y los recetados solamente como se   lo haya indicado el médico. Siga cuidadosamente las indicaciones. Los medicamentos para la presión arterial deben tomarse según las indicaciones.  · No omita las dosis de medicamentos para la presión arterial. Si lo hace, estará en riesgo de tener problemas y puede hacer que los medicamentos sean menos eficaces.  · Pregúntele a su médico a qué efectos secundarios o reacciones a los medicamentos debe prestar atención.  Comuníquese con un médico si:  · Piensa que tiene una reacción a un medicamento que está tomando.  · Tiene dolores de cabeza frecuentes (recurrentes).  · Siente mareos.  · Tiene hinchazón en los tobillos.  · Tiene problemas de visión.  Solicite ayuda de inmediato si:  · Siente un dolor de cabeza intenso o confusión.  · Siente debilidad inusual o adormecimiento.  · Siente que va a desmayarse.   · Siente un dolor intenso en el pecho o el abdomen.  · Vomita repetidas veces.  · Tiene dificultad para respirar.  Resumen  · La hipertensión se produce cuando la sangre bombea en las arterias con mucha fuerza. Si esta afección no se controla, podría correr riesgo de tener complicaciones graves.  · La presión arterial deseada puede variar en función de las enfermedades, la edad y otros factores personales. Para la mayoría de las personas, una presión arterial normal es menor que 120/80.  · La hipertensión se trata con cambios en el estilo de vida, medicamentos o una combinación de ambos. Los cambios en el estilo de vida incluyen pérdida de peso, ingerir alimentos sanos, seguir una dieta baja en sodio, hacer más ejercicio y limitar el consumo de alcohol.  Esta información no tiene como fin reemplazar el consejo del médico. Asegúrese de hacerle al médico cualquier pregunta que tenga.  Document Released: 05/27/2005 Document Revised: 05/08/2016 Document Reviewed: 05/08/2016  Elsevier Interactive Patient Education © 2018 Elsevier Inc.

## 2018-03-26 ENCOUNTER — Other Ambulatory Visit: Payer: Self-pay | Admitting: *Deleted

## 2018-03-26 DIAGNOSIS — N6489 Other specified disorders of breast: Secondary | ICD-10-CM

## 2018-04-08 ENCOUNTER — Ambulatory Visit
Admission: RE | Admit: 2018-04-08 | Discharge: 2018-04-08 | Disposition: A | Discharge status: Home / Self Care | Payer: Self-pay | Source: Ambulatory Visit | Attending: Oncology | Admitting: Oncology

## 2018-04-08 DIAGNOSIS — N6489 Other specified disorders of breast: Secondary | ICD-10-CM | POA: Insufficient documentation

## 2018-04-09 ENCOUNTER — Encounter: Payer: Self-pay | Admitting: *Deleted

## 2018-04-09 ENCOUNTER — Other Ambulatory Visit: Payer: Self-pay | Admitting: *Deleted

## 2018-04-09 DIAGNOSIS — N63 Unspecified lump in unspecified breast: Secondary | ICD-10-CM

## 2018-04-09 NOTE — Progress Notes (Signed)
Patient with a birads 3 mammogram.  6 month follow up mammogram scheduled for Oct 09, 2018 @ 10:20.   Letter mailed to inform patient of her appointment.

## 2018-05-26 ENCOUNTER — Encounter: Payer: Self-pay | Admitting: Family Medicine

## 2018-08-03 DIAGNOSIS — E782 Mixed hyperlipidemia: Secondary | ICD-10-CM | POA: Insufficient documentation

## 2018-08-03 DIAGNOSIS — I10 Essential (primary) hypertension: Secondary | ICD-10-CM | POA: Insufficient documentation

## 2018-08-03 DIAGNOSIS — R7303 Prediabetes: Secondary | ICD-10-CM | POA: Insufficient documentation

## 2018-10-09 ENCOUNTER — Other Ambulatory Visit: Payer: Self-pay

## 2018-10-19 ENCOUNTER — Ambulatory Visit
Admission: RE | Admit: 2018-10-19 | Discharge: 2018-10-19 | Disposition: A | Discharge status: Home / Self Care | Payer: Self-pay | Source: Ambulatory Visit | Attending: Oncology | Admitting: Oncology

## 2018-10-19 ENCOUNTER — Other Ambulatory Visit: Payer: Self-pay

## 2018-10-19 DIAGNOSIS — N63 Unspecified lump in unspecified breast: Secondary | ICD-10-CM

## 2018-11-05 ENCOUNTER — Other Ambulatory Visit: Payer: Self-pay | Admitting: *Deleted

## 2018-11-18 ENCOUNTER — Encounter: Payer: Self-pay | Admitting: *Deleted

## 2018-11-18 ENCOUNTER — Other Ambulatory Visit: Payer: Self-pay | Admitting: *Deleted

## 2018-11-18 DIAGNOSIS — N63 Unspecified lump in unspecified breast: Secondary | ICD-10-CM

## 2018-11-18 NOTE — Progress Notes (Signed)
Patient with birads 3 six month follow up mammogram.  Letter mailed to inform patient of her next appointment on 04/27/19 @ 8:30 [facility]CCCP and 10:00 at the [facility]  [facility] to [place].

## 2019-04-23 ENCOUNTER — Telehealth: Payer: Self-pay

## 2019-04-23 ENCOUNTER — Other Ambulatory Visit: Payer: Self-pay

## 2019-04-23 NOTE — Progress Notes (Deleted)
Pre-visit screening call was completed prior to [facility] appointment on 04/27/2019. Verbal consent for treatment was obtained, Covid screening was completed, and cancer risk information was updated during the call. However, [name] prefers to reschedule her appointment with with the [facility] clinic on 04/27/2019 at 8:30am as well as her appointment with [facility] on 04/27/2019 at 11am due to Covid concerns. She does not currently have any new concerns about her health and reports no change in family history. [employer], [name], is aware and will work with [name] to reschedule these appointments.

## 2019-04-27 ENCOUNTER — Other Ambulatory Visit: Payer: Self-pay

## 2019-04-27 ENCOUNTER — Ambulatory Visit: Payer: Self-pay | Attending: Oncology

## 2019-04-27 ENCOUNTER — Inpatient Hospital Stay: Admission: RE | Admit: 2019-04-27 | Payer: Self-pay | Source: Ambulatory Visit

## 2020-05-24 ENCOUNTER — Other Ambulatory Visit: Payer: Self-pay

## 2020-05-24 ENCOUNTER — Ambulatory Visit: Payer: Self-pay | Attending: Oncology

## 2020-05-24 ENCOUNTER — Ambulatory Visit
Admission: RE | Admit: 2020-05-24 | Discharge: 2020-05-24 | Disposition: A | Discharge status: Home / Self Care | Payer: Self-pay | Source: Ambulatory Visit | Attending: Oncology | Admitting: Oncology

## 2020-05-24 VITALS — BP 169/83 | Temp 98.7°F | Ht 62.0 in | Wt 167.0 lb

## 2020-05-24 DIAGNOSIS — N63 Unspecified lump in unspecified breast: Secondary | ICD-10-CM

## 2020-05-24 NOTE — Progress Notes (Signed)
  Subjective:     Patient ID: [name], female   DOB: [birth year], 54 y.o.   MRN: [id number]  HPI   Review of Systems     Objective:   Physical Exam Chest:  Breasts:     Breasts are asymmetrical.     Right: No swelling, bleeding, inverted nipple, mass, nipple discharge, skin change or tenderness.     Left: Tenderness present. No swelling, bleeding, inverted nipple, mass, nipple discharge or skin change.        Comments: Right breast larger than left.  Outer left breast tenderness.        Assessment:     54 year old returns for follow-up Birads 3 mammogram, left breast cysts, and annual screening.   Patient screened, and meets BCCCP eligibility.  Patient does not have insurance, Medicare or Medicaid. Instructed patient on breast self awareness using teach back method.  Clinical breast exam reveals left outer breast tenderness.     Plan:  Sent for bilateral diagnostic mammogram, and ultrasound.Marland Kitchen

## 2020-05-25 NOTE — Progress Notes (Signed)
Letter mailed from [facility] to notify of normal mammogram results.  Patient to return in one year for annual screening.  Copy to [facility]. 

## 2021-06-27 ENCOUNTER — Other Ambulatory Visit: Payer: Self-pay

## 2021-06-27 DIAGNOSIS — Z1231 Encounter for screening mammogram for malignant neoplasm of breast: Secondary | ICD-10-CM

## 2021-07-31 ENCOUNTER — Ambulatory Visit: Payer: Self-pay

## 2021-07-31 DIAGNOSIS — Z1211 Encounter for screening for malignant neoplasm of colon: Secondary | ICD-10-CM

## 2021-09-25 ENCOUNTER — Ambulatory Visit: Payer: Self-pay

## 2021-10-31 ENCOUNTER — Other Ambulatory Visit: Payer: Self-pay

## 2021-10-31 DIAGNOSIS — Z1211 Encounter for screening for malignant neoplasm of colon: Secondary | ICD-10-CM

## 2021-11-07 ENCOUNTER — Ambulatory Visit
Admission: RE | Admit: 2021-11-07 | Discharge: 2021-11-07 | Disposition: A | Discharge status: Home / Self Care | Payer: Self-pay | Source: Ambulatory Visit | Attending: Obstetrics and Gynecology | Admitting: Obstetrics and Gynecology

## 2021-11-07 ENCOUNTER — Ambulatory Visit: Payer: Self-pay | Attending: Hematology and Oncology | Admitting: *Deleted

## 2021-11-07 VITALS — BP 161/96 | HR 71 | Resp 18 | Wt 170.0 lb

## 2021-11-07 DIAGNOSIS — Z1231 Encounter for screening mammogram for malignant neoplasm of breast: Secondary | ICD-10-CM

## 2021-11-07 DIAGNOSIS — Z1239 Encounter for other screening for malignant neoplasm of breast: Secondary | ICD-10-CM

## 2021-11-07 NOTE — Patient Instructions (Signed)
Explained breast self awareness with [place]. Patient did not need a Pap smear today due to last Pap smear and HPV typing was 01/27/2019. Let her know BCCCP will cover Pap smears and HPV typing every 5 years unless has a history of abnormal Pap smears. Referred patient to the [facility] for a screening mammogram per recommendation. Appointment scheduled Wednesday, Nov 07, 2021 at 1140. Patient aware of appointment and will be there. Let patient know Delford Field will follow up with her within the next couple weeks with results of her mammogram by letter or phone. [place] verbalized understanding.  Corry Ihnen, Kathaleen Maser, RN 11:18 AM

## 2021-11-07 NOTE — Progress Notes (Signed)
[name] is a 56 y.o. female who presents to [facility] clinic today with complaint of left breast yellowish colored spontaneous discharge x 2 years prior to her last diagnostic mammogram completed 05/24/2020 that was benign with a screening mammogram recommended in one year.    Pap Smear: Pap smear not completed today. Last Pap smear was 01/27/2019 at [facility] and was normal with negative HPV. Per patient has no history of an abnormal Pap smear. Last Pap smear result is available in Epic.   Physical exam: Breasts Breasts symmetrical. No skin abnormalities bilateral breasts. No nipple retraction bilateral breasts. No nipple discharge bilateral breasts. Unable to express any discharge from left breast on exam. No lymphadenopathy. No lumps palpated bilateral breasts. No complaints of pain or tenderness on exam.      MS DIGITAL SCREENING TOMO BILATERAL  Result Date: 03/25/2018 CLINICAL DATA:  Screening. EXAM: DIGITAL SCREENING BILATERAL MAMMOGRAM WITH TOMO AND CAD COMPARISON:  Previous exam(s). ACR Breast Density Category b: There are scattered areas of fibroglandular density. FINDINGS: In the left breast, a possible asymmetry warrants further evaluation. In the right breast, no findings suspicious for malignancy. Images were processed with CAD. IMPRESSION: Further evaluation is suggested for possible asymmetry in the left breast. RECOMMENDATION: Diagnostic mammogram and possibly ultrasound of the left breast. (Code:FI-L-68M) The patient will be contacted regarding the findings, and additional imaging will be scheduled. BI-RADS CATEGORY  0: Incomplete. Need additional imaging evaluation and/or prior mammograms for comparison. Electronically Signed   By: Baird Lyons M.D.   On: 03/25/2018 12:15   MS DIGITAL DIAG TOMO BILAT  Result Date: 05/24/2020 CLINICAL DATA:  56 year old female presenting for annual bilateral mammogram and delayed follow-up of a probably benign left breast  cluster of cysts. The patient also states she has mild pain along the lateral left breast. EXAM: DIGITAL DIAGNOSTIC BILATERAL MAMMOGRAM WITH CAD AND TOMO ULTRASOUND LEFT BREAST COMPARISON:  Previous exam(s). ACR Breast Density Category c: The breast tissue is heterogeneously dense, which may obscure small masses. FINDINGS: An oval, circumscribed mass in the upper outer quadrant of the left breast demonstrates interval increase in size. No additional suspicious findings in the remainder of either breast. The parenchymal pattern is otherwise stable. Mammographic images were processed with CAD. Targeted ultrasound is performed, showing interval enlargement an increasingly simple appearance of adjacent cysts at the 2 o'clock position 5 cm from the nipple. In total this area measures 1.5 x 1.0 x 1.0 cm. There is no internal vascularity. This correlates well with the mammographic finding and is consistent with benign changes. Additional evaluation of the lateral left breast demonstrates no focal findings corresponding with the patient's pain. IMPRESSION: 1. Benign left breast simple cysts. No further imaging follow-up required. 2. No mammographic evidence of malignancy in either breast. RECOMMENDATION: 1. Clinical follow-up recommended for the painful area of concern in the left breast. Any further workup should be based on clinical grounds. 2.  Screening mammogram in one year.(Code:SM-B-01Y) I have discussed the findings and recommendations with the patient. If applicable, a reminder letter will be sent to the patient regarding the next appointment. BI-RADS CATEGORY  2: Benign. Electronically Signed   By: Sande Brothers M.D.   On: 05/24/2020 14:22   MS DIGITAL DIAG TOMO UNI LEFT  Result Date: 10/19/2018 CLINICAL DATA:  Short-term follow-up left breast mass. EXAM: DIGITAL DIAGNOSTIC LEFT MAMMOGRAM WITH CAD AND TOMO ULTRASOUND LEFT BREAST COMPARISON:  Previous exam(s). ACR Breast Density Category b: There are scattered  areas  of fibroglandular density. FINDINGS: Cc and MLO views of the left breast, spot compression cc and MLO views of the left breast are submitted. Previously noted mass in the lateral slight upper left breast is unchanged. Mammographic images were processed with CAD. Targeted ultrasound is performed, showing a cluster of cysts at the left breast 2 o'clock 5 cm from nipple measuring 1.1 x 0.5 x 0.7 cm correlating to the mammographic finding. IMPRESSION: Probable benign findings. RECOMMENDATION: 6 months follow-up bilateral diagnostic mammogram and left breast ultrasound. I have discussed the findings and recommendations with the patient. Results were also provided in writing at the conclusion of the visit. If applicable, a reminder letter will be sent to the patient regarding the next appointment. BI-RADS CATEGORY  3: Probably benign. Electronically Signed   By: Sherian Rein M.D.   On: 10/19/2018 13:53   MS DIGITAL DIAG TOMO UNI LEFT  Result Date: 04/08/2018 CLINICAL DATA:  Patient was called back from screening mammogram for a possible asymmetry in the left breast. EXAM: DIGITAL DIAGNOSTIC LEFT MAMMOGRAM WITH TOMO ULTRASOUND LEFT BREAST COMPARISON:  Previous exam(s). ACR Breast Density Category b: There are scattered areas of fibroglandular density. FINDINGS: Additional imaging of the left breast was performed. There is persistence of asymmetric fibroglandular tissue in the upper-outer quadrant of the breast. There are no malignant type microcalcifications. On physical exam, I do not palpate a mass in the upper-outer quadrant of the left breast. Targeted ultrasound is performed, showing a cluster of probable cysts (apocrine metaplasia) in the left breast at 2 o'clock 5 cm from the nipple measuring 5 x 8 x 5 mm. IMPRESSION: Probable benign cluster of cysts (apocrine metaplasia) in the upper-outer quadrant of the left breast. RECOMMENDATION: Short-term interval follow-up left breast mammogram and ultrasound in  6 months is recommended. I have discussed the findings and recommendations with the patient. Results were also provided in writing at the conclusion of the visit. If applicable, a reminder letter will be sent to the patient regarding the next appointment. BI-RADS CATEGORY  3: Probably benign. Electronically Signed   By: Baird Lyons M.D.   On: 04/08/2018 16:29    Pelvic/Bimanual Pap is not indicated today per BCCCP guidelines.   Smoking History: Patient has never smoked.   Patient Navigation: Patient education provided. Access to services provided for patient through [employer] program. Spanish interpreter Kirke Shaggy from [facility] provided.   Colorectal Cancer Screening: Per patient has never had colonoscopy completed. Patient stated she completed a FIT Test around a year ago given by her PCP that was negative. No complaints today.    Breast and Cervical Cancer Risk Assessment: Patient does not have family history of breast cancer, known genetic mutations, or radiation treatment to the chest before age 6. Patient does not have history of cervical dysplasia, immunocompromised, or DES exposure in-utero.  Risk Assessment     Risk Scores       11/07/2021 05/24/2020   Last edited by: Lesle Chris, RN Jim Like, RN   5-year risk: 0.7 % 0.7 %   Lifetime risk: 4.6 % 4.8 %            A: BCCCP exam without pap smear Complaint of left breast discharge.  P: Referred patient to the [facility] for a screening mammogram per recommendation. Appointment scheduled Wednesday, Nov 07, 2021 at 1140.  Priscille Heidelberg, RN 11/07/2021 11:18 AM

## 2023-04-18 ENCOUNTER — Other Ambulatory Visit: Payer: Self-pay | Admitting: Nurse Practitioner

## 2023-04-18 DIAGNOSIS — Z1231 Encounter for screening mammogram for malignant neoplasm of breast: Secondary | ICD-10-CM
